# Patient Record
Sex: Male | Born: 1985 | Race: Black or African American | Hispanic: No | Marital: Single | Smoking: Never smoker
Health system: Southern US, Community
[De-identification: ages and names within clinical notes are randomized; demographics above are authoritative.]

## PROBLEM LIST (undated history)

## (undated) ENCOUNTER — Emergency Department (HOSPITAL_BASED_OUTPATIENT_CLINIC_OR_DEPARTMENT_OTHER): Discharge status: Absent without leave | Payer: PRIVATE HEALTH INSURANCE

## (undated) DIAGNOSIS — H2703 Aphakia, bilateral: Secondary | ICD-10-CM

## (undated) DIAGNOSIS — I272 Pulmonary hypertension, unspecified: Secondary | ICD-10-CM

## (undated) DIAGNOSIS — G4733 Obstructive sleep apnea (adult) (pediatric): Secondary | ICD-10-CM

## (undated) DIAGNOSIS — I509 Heart failure, unspecified: Secondary | ICD-10-CM

## (undated) DIAGNOSIS — I10 Essential (primary) hypertension: Secondary | ICD-10-CM

## (undated) DIAGNOSIS — J9601 Acute respiratory failure with hypoxia: Secondary | ICD-10-CM

## (undated) DIAGNOSIS — I5033 Acute on chronic diastolic (congestive) heart failure: Secondary | ICD-10-CM

## (undated) DIAGNOSIS — J449 Chronic obstructive pulmonary disease, unspecified: Secondary | ICD-10-CM

## (undated) DIAGNOSIS — R079 Chest pain, unspecified: Secondary | ICD-10-CM

## (undated) DIAGNOSIS — E119 Type 2 diabetes mellitus without complications: Secondary | ICD-10-CM

## (undated) DIAGNOSIS — H109 Unspecified conjunctivitis: Secondary | ICD-10-CM

## (undated) DIAGNOSIS — H33021 Retinal detachment with multiple breaks, right eye: Secondary | ICD-10-CM

## (undated) DIAGNOSIS — I82409 Acute embolism and thrombosis of unspecified deep veins of unspecified lower extremity: Secondary | ICD-10-CM

## (undated) DIAGNOSIS — Z8679 Personal history of other diseases of the circulatory system: Secondary | ICD-10-CM

## (undated) HISTORY — DX: Heart failure, unspecified: I50.9

## (undated) HISTORY — PX: EYE SURGERY: SHX253

## (undated) HISTORY — PX: RETINAL DETACHMENT SURGERY: SHX105

## (undated) HISTORY — DX: Acute on chronic diastolic (congestive) heart failure: I50.33

## (undated) HISTORY — DX: Morbid (severe) obesity due to excess calories: E66.01

## (undated) HISTORY — DX: Chest pain, unspecified: R07.9

## (undated) HISTORY — DX: Personal history of other diseases of the circulatory system: Z86.79

## (undated) HISTORY — DX: Unspecified conjunctivitis: H10.9

## (undated) HISTORY — DX: Aphakia, bilateral: H27.03

## (undated) HISTORY — DX: Retinal detachment with multiple breaks, right eye: H33.021

## (undated) HISTORY — DX: Obstructive sleep apnea (adult) (pediatric): G47.33

## (undated) HISTORY — PX: CATARACT EXTRACTION: SUR2

---
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
## 2010-08-19 ENCOUNTER — Emergency Department (HOSPITAL_BASED_OUTPATIENT_CLINIC_OR_DEPARTMENT_OTHER): Admission: EM | Admit: 2010-08-19 | Discharge: 2010-08-19 | Payer: Self-pay | Admitting: Emergency Medicine

## 2011-01-12 LAB — HERPES SIMPLEX VIRUS CULTURE

## 2011-01-12 LAB — URINALYSIS, ROUTINE W REFLEX MICROSCOPIC
Glucose, UA: NEGATIVE mg/dL
Nitrite: NEGATIVE
Specific Gravity, Urine: 1.023 (ref 1.005–1.030)
pH: 6.5 (ref 5.0–8.0)

## 2011-01-12 LAB — URINE MICROSCOPIC-ADD ON

## 2011-10-22 ENCOUNTER — Encounter: Payer: Self-pay | Admitting: *Deleted

## 2011-10-22 ENCOUNTER — Emergency Department (HOSPITAL_BASED_OUTPATIENT_CLINIC_OR_DEPARTMENT_OTHER)
Admission: EM | Admit: 2011-10-22 | Discharge: 2011-10-22 | Disposition: A | Discharge status: Home / Self Care | Payer: Self-pay | Attending: Emergency Medicine | Admitting: Emergency Medicine

## 2011-10-22 DIAGNOSIS — H5710 Ocular pain, unspecified eye: Secondary | ICD-10-CM | POA: Insufficient documentation

## 2011-10-22 DIAGNOSIS — H109 Unspecified conjunctivitis: Secondary | ICD-10-CM | POA: Insufficient documentation

## 2011-10-22 MED ORDER — PREDNISOLONE ACETATE 1 % OP SUSP
OPHTHALMIC | Status: AC
  Administered 2011-10-22: 1 [drp] via OPHTHALMIC
  Filled 2011-10-22: qty 5

## 2011-10-22 MED ORDER — PREDNISOLONE ACETATE 1 % OP SUSP
1.0000 [drp] | OPHTHALMIC | Status: DC
Start: 2011-10-23 — End: 2011-10-23
  Administered 2011-10-22: 1 [drp] via OPHTHALMIC

## 2011-10-22 NOTE — ED Notes (Signed)
Left eye markedly swollen- pt states that he thinks he has pink eye and has been using prescribed drops- pt woke up this morning and eye was markedly swollen- denies pain

## 2011-10-22 NOTE — ED Provider Notes (Signed)
History   This chart was scribed for Rolan Bucco, MD scribed by Magnus Sinning. The patient was seen in room MH03/MH03    CSN: [id number]  Arrival date & time 10/22/11  1806   First MD Initiated Contact with Patient 10/22/11 2141      Chief Complaint  Patient presents with  . Eye Problem    (Consider location/radiation/quality/duration/timing/severity/associated sxs/prior treatment) HPI Edward Castro is a 25 y.o. male who presents to the Emergency Department complaining of severe swelling to his left eye with associated redness and discharge, onset 7 days ago. Pt reports that he is unable to see out of left eye due to swelling and has discharge when he wakes up from sleeping. Pt was recently dx'd with pink eye and treated with polymyxin eyedrops with no improvement. Pt denies fever and has a hx of retina detachment, which pt's mother states occurred when he was 18 months. Pt's mother states that pt was treated for retina detachment with laser surgery and scleral buckle repair on left eye.   History reviewed. No pertinent past medical history.  Past Surgical History  Procedure Date  . Eye surgery     History reviewed. No pertinent family history.  History  Substance Use Topics  . Smoking status: Never Smoker   . Smokeless tobacco: Never Used  . Alcohol Use: No      Review of Systems  Constitutional: Negative for fever.  Eyes: Positive for discharge and visual disturbance (Unable to see out of left eye due to swelling).       Swelling of conjunctiva   All other systems reviewed and are negative.    Allergies  Review of patient's allergies indicates no known allergies.  Home Medications   Current Outpatient Rx  Name Route Sig Dispense Refill  . POLYMYXIN B-TRIMETHOPRIM 10000-0.1 UNIT/ML-% OP SOLN Left Eye Place 1 drop into the left eye 4 (four) times daily.        BP 165/99  Pulse 104  Temp(Src) 98.3 F (36.8 C) (Oral)  Resp 18  Ht 5\' 8"  (1.727 m)  Wt 275  lb (124.739 kg)  BMI 41.81 kg/m2  SpO2 94%  Physical Exam  Nursing note and vitals reviewed. Constitutional: He is oriented to person, place, and time. He appears well-developed and well-nourished. No distress.  HENT:  Head: Normocephalic and atraumatic.  Eyes: EOM are normal. Pupils are equal, round, and reactive to light.       Left eye is markedly conjunctiva is red and inflamed. Conjunctiva to the lower eyelid is enlarged and bulging. EOMI intact  Some mild watery drainage   Neck: Normal range of motion. Neck supple.  Cardiovascular: Normal rate, regular rhythm and intact distal pulses.   Pulmonary/Chest: Effort normal and breath sounds normal. No respiratory distress.  Abdominal: Normal appearance. He exhibits no distension.  Musculoskeletal: Normal range of motion.  Neurological: He is alert and oriented to person, place, and time.  Skin: Skin is warm and dry. No rash noted.  Psychiatric: He has a normal mood and affect. His behavior is normal.    ED Course  Procedures (including critical care time) DIAGNOSTIC STUDIES: Oxygen Saturation is 92% on room air, normal by my interpretation.    COORDINATION OF CARE:  Pt with 20/200 visual acuity to left eye, says that this is his baseline (had retinal detachment in that eye as toddler)  1. Conjunctivitis       MDM  Conjunctivitis vs allergic rx.  Pt does not wear contacts.  Spoke with opthamologist on call, Dr. Delaney Meigs who recommends steroid eye drops, cold compresses  (feels that this could be allergic rx to eye drops).  He will see pt in the am at office.  Gave pt contact information and instructions to meet him at 10:30am tomorrow.  Vision is at baseline   I personally performed the services described in this documentation, which was scribed in my presence.  The recorded information has been reviewed and considered.          Rolan Bucco, MD 10/22/11 [phone]

## 2011-10-22 NOTE — ED Notes (Signed)
Patient is resting comfortably, states that he started abx eye drops this morning for pink eye.  Here because both upper and lower eye lids in left eye are markedly swollen.

## 2013-07-27 ENCOUNTER — Emergency Department (HOSPITAL_BASED_OUTPATIENT_CLINIC_OR_DEPARTMENT_OTHER)
Admission: EM | Admit: 2013-07-27 | Discharge: 2013-07-27 | Disposition: A | Discharge status: Other Acute Inpatient Hospital | Payer: BC Managed Care – PPO | Attending: Emergency Medicine | Admitting: Emergency Medicine

## 2013-07-27 ENCOUNTER — Encounter (HOSPITAL_BASED_OUTPATIENT_CLINIC_OR_DEPARTMENT_OTHER): Payer: Self-pay | Admitting: Emergency Medicine

## 2013-07-27 DIAGNOSIS — H5450 Low vision, one eye, unspecified eye: Secondary | ICD-10-CM | POA: Insufficient documentation

## 2013-07-27 DIAGNOSIS — I10 Essential (primary) hypertension: Secondary | ICD-10-CM | POA: Insufficient documentation

## 2013-07-27 DIAGNOSIS — E119 Type 2 diabetes mellitus without complications: Secondary | ICD-10-CM | POA: Insufficient documentation

## 2013-07-27 DIAGNOSIS — H5461 Unqualified visual loss, right eye, normal vision left eye: Secondary | ICD-10-CM

## 2013-07-27 HISTORY — DX: Essential (primary) hypertension: I10

## 2013-07-27 HISTORY — DX: Type 2 diabetes mellitus without complications: E11.9

## 2013-07-27 MED ORDER — TETRACAINE HCL 0.5 % OP SOLN
2.0000 [drp] | Freq: Once | OPHTHALMIC | Status: AC
  Administered 2013-07-27: 2 [drp] via OPHTHALMIC
  Filled 2013-07-27: qty 2

## 2013-07-27 NOTE — ED Provider Notes (Signed)
CSN: [id number]     Arrival date & time 07/27/13  2007 History  This chart was scribed for Rolan Bucco, MD by Greggory Stallion, ED Scribe. This patient was seen in room MH05/MH05 and the patient's care was started at 10:22 PM.   Chief Complaint  Patient presents with  . Blurred Vision   The history is provided by the patient. No language interpreter was used.    HPI Comments: Edward Castro is a 27 y.o. 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 Pt denies injury. Pt denies eye drainage, eye pain, eye redness, headache and trouble speaking. He states he wears glasses daily.   Opthalmologist is Dr. Manson Passey at [facility]  Past Medical History  Diagnosis Date  . Hypertension   . Diabetes mellitus without complication    Past Surgical History  Procedure Laterality Date  . Eye surgery    . Cataract extraction    . Retinal detachment surgery     History reviewed. No pertinent family history. History  Substance Use Topics  . Smoking status: Never Smoker   . Smokeless tobacco: Never Used  . Alcohol Use: No    Review of Systems  Constitutional: Negative for fever, chills, diaphoresis and fatigue.  HENT: Negative for congestion, rhinorrhea and sneezing.   Eyes: Positive for visual disturbance. Negative for pain, discharge and redness.  Respiratory: Negative for cough, chest tightness and shortness of breath.   Cardiovascular: Negative for chest pain and leg swelling.  Gastrointestinal: Negative for nausea, vomiting, abdominal pain, diarrhea and blood in stool.  Genitourinary: Negative for frequency, hematuria, flank pain and difficulty urinating.  Musculoskeletal: Negative for back pain and arthralgias.  Skin: Negative for rash.  Neurological: Negative for dizziness, speech difficulty, weakness, numbness and  headaches.    Allergies  Review of patient's allergies indicates no known allergies.  Home Medications   Current Outpatient Rx  Name  Route  Sig  Dispense  Refill  . trimethoprim-polymyxin b (POLYTRIM) ophthalmic solution   Left Eye   Place 1 drop into the left eye 4 (four) times daily.            BP 122/73  Pulse 78  Temp(Src) 98.4 F (36.9 C) (Oral)  Resp 20  Ht 5\' 8"  (1.727 m)  Wt 310 lb (140.615 kg)  BMI 47.15 kg/m2  SpO2 98%  Physical Exam  Constitutional: He is oriented to person, place, and time. He appears well-developed and well-nourished.  HENT:  Head: Normocephalic and atraumatic.  Eyes: Conjunctivae and EOM are normal. Pupils are equal, round, and reactive to light.  No redness. No drainage. Unable to visualize the fundi.   Neck: Normal range of motion. Neck supple.  Cardiovascular: Normal rate, regular rhythm and normal heart sounds.   Pulmonary/Chest: Effort normal and breath sounds normal. No respiratory distress. He has no wheezes. He has no rales. He exhibits no tenderness.  Abdominal: Soft. Bowel sounds are normal. There is no tenderness. There is no rebound and no guarding.  Musculoskeletal: Normal range of motion. He exhibits no edema.  Lymphadenopathy:    He has no cervical adenopathy.  Neurological: He is alert and oriented to person, place, and time.  Skin: Skin is warm and dry. No rash noted.  Psychiatric: He has a normal mood and affect.    ED Course  Procedures (including critical  care time)  DIAGNOSTIC STUDIES: Oxygen Saturation is 98% on RA, normal by my interpretation.    COORDINATION OF CARE: 10:26 PM-Discussed treatment plan which includes visual acuity with pt at bedside and pt agreed to plan. Advised pt to follow up with his eye doctor.    Labs Review Labs Reviewed - No data to display Imaging Review No results found.  MDM   1. Vision loss of right eye    Patient had normal pressures in the right eye. He has no redness or  drainage from the eye. He has no eye pain. His visual acuity was assessed and he was only able to see the E on the eye chart in the right eye.  I contacted Dr. Royal Hawthorn with ophtho at [facility] who advised to transfer pt on ED at [facility] to be seen tonight.  Pt is going POV with family.  Advised them to go directly to the ED.  I advised the ED physician, Dr. Lyla Son of pt's transfer       I personally performed the services described in this documentation, which was scribed in my presence.  The recorded information has been reviewed and considered.   Rolan Bucco, MD 07/28/13 [phone]

## 2013-07-27 NOTE — ED Notes (Signed)
Pt c/o Left eye blurred vision denies pain , injury , or event onset x1 day

## 2013-07-27 NOTE — ED Notes (Signed)
Pt's father was anxious to begin heading towards [facility].  Mother remained to complete paperwork, will follow them up there in a separate vehicle.  Dr. Fredderick Phenix is aware.  Unable to get 15 minute vital signs prior to leaving.  Spoke with Thayer Ohm, [facility].  He is aware patient is en route.

## 2015-03-30 ENCOUNTER — Encounter (HOSPITAL_BASED_OUTPATIENT_CLINIC_OR_DEPARTMENT_OTHER): Payer: Self-pay | Admitting: Emergency Medicine

## 2015-03-30 ENCOUNTER — Emergency Department (HOSPITAL_BASED_OUTPATIENT_CLINIC_OR_DEPARTMENT_OTHER)
Admission: EM | Admit: 2015-03-30 | Discharge: 2015-03-30 | Disposition: A | Discharge status: Home / Self Care | Payer: 59 | Attending: Emergency Medicine | Admitting: Emergency Medicine

## 2015-03-30 DIAGNOSIS — M25511 Pain in right shoulder: Secondary | ICD-10-CM | POA: Diagnosis present

## 2015-03-30 DIAGNOSIS — M5410 Radiculopathy, site unspecified: Secondary | ICD-10-CM | POA: Insufficient documentation

## 2015-03-30 DIAGNOSIS — Z792 Long term (current) use of antibiotics: Secondary | ICD-10-CM | POA: Diagnosis not present

## 2015-03-30 DIAGNOSIS — Z79899 Other long term (current) drug therapy: Secondary | ICD-10-CM | POA: Diagnosis not present

## 2015-03-30 DIAGNOSIS — E119 Type 2 diabetes mellitus without complications: Secondary | ICD-10-CM | POA: Diagnosis not present

## 2015-03-30 DIAGNOSIS — I10 Essential (primary) hypertension: Secondary | ICD-10-CM | POA: Diagnosis not present

## 2015-03-30 MED ORDER — TRAMADOL HCL 50 MG PO TABS: 50.0000 mg | ORAL_TABLET | Freq: Four times a day (QID) | ORAL | Status: DC | PRN

## 2015-03-30 MED ORDER — PREDNISONE 20 MG PO TABS: ORAL_TABLET | ORAL | Status: DC

## 2015-03-30 MED ORDER — ORPHENADRINE CITRATE ER 100 MG PO TB12: 100.0000 mg | ORAL_TABLET | Freq: Two times a day (BID) | ORAL | Status: DC

## 2015-03-30 NOTE — ED Notes (Signed)
Right shoulder pain for two weeks after using shotgun at shooting range.  Pt states pain to right shoulder radiating to neck.

## 2015-03-30 NOTE — Discharge Instructions (Signed)
Cervical Radiculopathy °Cervical radiculopathy happens when a nerve in the neck is pinched or bruised by a slipped (herniated) disk or by arthritic changes in the bones of the cervical spine. This can occur due to an injury or as part of the normal aging process. Pressure on the cervical nerves can cause pain or numbness that runs from your neck all the way down into your arm and fingers. °CAUSES  °There are many possible causes, including: °· Injury. °· Muscle tightness in the neck from overuse. °· Swollen, painful joints (arthritis). °· Breakdown or degeneration in the bones and joints of the spine (spondylosis) due to aging. °· Bone spurs that may develop near the cervical nerves. °SYMPTOMS  °Symptoms include pain, weakness, or numbness in the affected arm and hand. Pain can be severe or irritating. Symptoms may be worse when extending or turning the neck. °DIAGNOSIS  °Your caregiver will ask about your symptoms and do a physical exam. He or she may test your strength and reflexes. X-rays, CT scans, and MRI scans may be needed in cases of injury or if the symptoms do not go away after a period of time. Electromyography (EMG) or nerve conduction testing may be done to study how your nerves and muscles are working. °TREATMENT  °Your caregiver may recommend certain exercises to help relieve your symptoms. Cervical radiculopathy can, and often does, get better with time and treatment. If your problems continue, treatment options may include: °· Wearing a soft collar for short periods of time. °· Physical therapy to strengthen the neck muscles. °· Medicines, such as nonsteroidal anti-inflammatory drugs (NSAIDs), oral corticosteroids, or spinal injections. °· Surgery. Different types of surgery may be done depending on the cause of your problems. °HOME CARE INSTRUCTIONS  °· Put ice on the affected area. °¨ Put ice in a plastic bag. °¨ Place a towel between your skin and the bag. °¨ Leave the ice on for 15-20 minutes,  03-04 times a day or as directed by your caregiver. °· If ice does not help, you can try using heat. Take a warm shower or bath, or use a hot water bottle as directed by your caregiver. °· You may try a gentle neck and shoulder massage. °· Use a flat pillow when you sleep. °· Only take over-the-counter or prescription medicines for pain, discomfort, or fever as directed by your caregiver. °· If physical therapy was prescribed, follow your caregiver's directions. °· If a soft collar was prescribed, use it as directed. °SEEK IMMEDIATE MEDICAL CARE IF:  °· Your pain gets much worse and cannot be controlled with medicines. °· You have weakness or numbness in your hand, arm, face, or leg. °· You have a high fever or a stiff, rigid neck. °· You lose bowel or bladder control (incontinence). °· You have trouble with walking, balance, or speaking. °MAKE SURE YOU:  °· Understand these instructions. °· Will watch your condition. °· Will get help right away if you are not doing well or get worse. °Document Released: 07/12/2001 Document Revised: 01/09/2012 Document Reviewed: 05/31/2011 °ExitCare® Patient Information ©2015 [employer]. This information is not intended to replace advice given to you by your health care provider. Make sure you discuss any questions you have with your health care provider. ° ° ° °Emergency Department Resource Guide °1) Find a Doctor and Pay Out of Pocket °Although you won't have to find out who is covered by your insurance plan, it is a good idea to ask around and get recommendations. You   will then need to call the office and see if the doctor you have chosen will accept you as a new patient and what types of options they offer for patients who are self-pay. Some doctors offer discounts or will set up payment plans for their patients who do not have insurance, but you will need to ask so you aren't surprised when you get to your appointment. ° °2) Contact Your Local Health Department °Not all  health departments have doctors that can see patients for sick visits, but many do, so it is worth a call to see if yours does. If you don't know where your local health department is, you can check in your phone book. The CDC also has a tool to help you locate your state's health department, and many state websites also have listings of all of their local health departments. ° °3) Find a Walk-in Clinic °If your illness is not likely to be very severe or complicated, you may want to try a walk in clinic. These are popping up all over the country in pharmacies, drugstores, and shopping centers. They're usually staffed by nurse practitioners or physician assistants that have been trained to treat common illnesses and complaints. They're usually fairly quick and inexpensive. However, if you have serious medical issues or chronic medical problems, these are probably not your best option. ° °No Primary Care Doctor: °- Call [facility] at  832-8000 - they can help you locate a primary care doctor that  accepts your insurance, provides certain services, etc. °- Physician Referral Service- 1-800-533-3463 ° °Chronic Pain Problems: °Organization         Address  Phone   Notes  °[facility]  (336) 297-2271 Patients need to be referred by their primary care doctor.  ° °Medication Assistance: °Organization         Address  Phone   Notes  °[place] [facility] [address] °[place], [place] [address] (336) 641-8030 --Must be a resident of [place] °-- Must have NO insurance coverage whatsoever (no Medicaid/ Medicare, etc.) °-- The pt. MUST have a primary care doctor that directs their care regularly and follows them in the community °  °[facility]  (866) 331-1348   °[employer]  (888) 892-1162   ° °Agencies that provide inexpensive medical care: °Organization         Address  Phone   Notes  °[facility]  (336) 832-8035   °[facility]     (336) 832-7272   °[facility] [address] °[place], [place] [address] (336) 832-4777   °[facility] [address], °[place] (336) 271-4999   °[facility]    (336) 373-0678   °[facility]    (336) 272-1050   °[facility] ° [address], [place] Phone:  (336) 832-4444, Fax:  (336) 832-4440 Hours of Operation:  9 am - 6 pm, M-F.  Also accepts Medicaid/Medicare and self-pay.  °[facility] ° [address],  Phone: (336) 832-3150, Fax: (336) 832-3151. Hours of Operation:  8:30 am - 5:30 pm, M-F.  Also accepts Medicaid and self-pay.  °[facility] [place] [address], [place] Phone: (336) 878-6027   °[facility] [address], [place], [place] (336)723-1848, Ext. 123 Mondays & Thursdays: 7-9 AM.  First 15 patients are seen on a first come, first   serve basis. °  ° °Medicaid-accepting [place] Providers: ° °Organization         Address  Phone   Notes  °[facility] [address], [place] (336) 641-2100 Also accepts self-pay patients.  °[facility] [address], [place] ° (336) 856-9996   °[facility] [address], [place] (336) 288-8857   °[facility] [address] [place] [address], [place] (336) 299-7000   °Veita Bland [address], [place]  ° (336) 373-1557 Only accepts [place] Access Medicaid patients after they have their name applied to their card.  ° °Self-Pay (no insurance) in [place]: ° °Organization         Address  Phone   Notes  °Sickle Cell Patients, [facility] [address], [place] (336) 832-1970   °[facility] [address], [place] (336) 832-4400   °[facility] [place] ° [address], [place] (336) 992-4800     °[facility]/Dr. Osei-Bonsu ° [address] [place] [address], [place] or [address], [place] (336) 841-8500 Phone number for both [place] and  [place] locations is the same.  °[facility] [address], [place] (336) 299-0000   °[facility] [address] [place] [address], [place] or [address] (336) 852-7530 °(336) 878-2260   °[facility] [address], [place] (336) 350-1642, phone; (336) 294-5005, fax Sees patients 1st and 3rd Saturday of every month.  Must not qualify for public or private insurance (i.e. Medicaid, Medicare, [place] Health Choice, Veterans' Benefits) • Household income should be no more than 200% of the poverty level •The clinic cannot treat you if you are pregnant or think you are pregnant • Sexually transmitted diseases are not treated at the clinic.  ° ° °Dental Care: °Organization         Address  Phone  Notes  °[facility] [address], [place] (336) 641-6152 Accepts children up to age 21 who are enrolled in Medicaid or [place] Health Choice; pregnant women with a Medicaid card; and children who have applied for Medicaid or [address] Health Choice, but were declined, whose parents can pay a reduced fee at time of service.  °[facility] [place]  [address], [place] (336) 641-7733 Accepts children up to age 21 who are enrolled in Medicaid or [place] Health Choice; pregnant women with a Medicaid card; and children who have applied for Medicaid or [place] Health Choice, but were declined, whose parents can pay a reduced fee at time of service.  °[facility] ° [address], [place] (336) 641-4533 Patients are seen by appointment only. Walk-ins are not accepted. [facility] will see patients 18 years of age and older. °Monday - Tuesday (8am-5pm) °Most Wednesdays (8:30-5pm) °$30 per visit,  cash only  °[facility] ° [address], [place] (336) 641-4533 Patients are seen by appointment only. Walk-ins are not accepted. [facility] will see patients 18 years of age and older. °One Wednesday Evening (Monthly: Volunteer Based).  $30 per visit, cash only  °[facility]  (919) 537-3737 for adults; Children under age 4, call Graduate Pediatric Dentistry at (919) [phone]. Children aged 4-14, please   call (919) [phone] to request a pediatric application. ° Dental services are provided in all areas of dental care including fillings, crowns and bridges, complete and partial dentures, implants, gum treatment, root canals, and extractions. Preventive care is also provided. Treatment is provided to both adults and children. °Patients are selected via a lottery and there is often a waiting list. °  °[facility] [address], °[place] ° (336) 763-8833 [email or web address] °  °[facility] [address], [place], [place] (336)723-1848, Ext. 123 Second and Fourth Thursday of each month, opens at 6:30 AM; Clinic ends at 9 AM.  Patients are seen on a first-come first-served basis, and a limited number are seen during each clinic.  ° °[facility] ° [address], [place], [place] (336) 723-7904   Eligibility Requirements °You must have lived in [place], or [place] counties for at least the last three months. °  You cannot be eligible for state or federal sponsored healthcare insurance, including Veterans Administration, Medicaid, or Medicare. °  You generally cannot be eligible for healthcare insurance through your employer.  °  How to apply: °Eligibility screenings are held every Tuesday and Wednesday afternoon from 1:00 pm until 4:00 pm. You do not need an appointment for the interview!  °[facility] [address], [place], [place] 336-631-2330   °[facility]   336-342-8273   °[facility]  336-703-3100   °[facility]  336-570-6415   ° °Behavioral Health Resources in the Community: °Intensive Outpatient Programs °Organization         Address  Phone  Notes  °[facility] [address], [place], [place] 336-878-6098   °[facility] [address], [place], [place] 336-832-9800   °[facility] [address], [place], [place] ° 336-882-2125   °[facility] [address],  °[place], [place] 1-800-853-5163 or 336-641-4981   °Substance Abuse Resources °Organization         Address  Phone  Notes  °[facility]  336-882-2125   °[facility]  336-784-9470   °[facility]  336-285-9073   °[facility]  336-845-3988   °Residential & Outpatient Substance Abuse Program  1-800-659-3381   °Psychological Services °Organization         Address  Phone  Notes  °[facility]  336- 832-9600   °[facility]  336- 378-7881   °[facility] [address], [place] 1-800-853-5163 or 336-641-4981   ° °Mobile Crisis Teams °Organization         Address  Phone  Notes  °[facility], Mobile Crisis Care Unit  1-877-626-1772   °Assertive °[facility] ° [address] [place], [place] 336-834-9664   °Sharon DeEsch [address] °[place] 336-554-5454   ° °Self-Help/Support Groups °Organization         Address  Phone             Notes  °[facility] [place] - variety of support groups  336- 373-1402 Call for more information  °Narcotics Anonymous (NA), [facility] [address], °[place]  2 meetings at this location  ° °Residential Treatment Programs °Organization         Address  Phone  Notes  °[facility] [address],    °[place]  1-866-801-8205   °[facility] °   [address], [place], [place] 704-293-8524    °[facility] [address], [place] 336-845-3988 Admissions: 8am-3pm M-F  °[facility] [address]    °[place], [place] 336-841-1104   °[facility] [address], [place], [place] 336-379-7146   °[facility] [address]  °[place], [place] 336-285-9073   °[facility] Intensive Outpatient [address], [place], [place] 336-852-3033   °[facility] [address]  °[place], [place] 1-877-615-2722 or 336-784-9470   °[facility] [address] [place], [place] 336-227-7417 Accepts Medicaid  °[facility] [address]  °[place] 1-800-659-3381 Substance Abuse/Addiction Treatment  ° °[place] Behavioral Health Resources °Organization         Address  Phone  Notes  °[facility]  (888) 581-9988   °Julie Brannon, PhD [address] [place], [place]   (336) 349-5553 or (336) 951-0000   °[facility]   [address] °[place], [place] (336) 349-4454   °[facility] [address], [place], [place] (336) 342-8316 Insurance/Medicaid/sponsorship through [facility]  °[facility] [address]                                    [place], [place] (336) 342-8316 Therapy/tele-psych/case  °[facility] [address]  ° [place], [place] (336) 349-2233    °Dr. Arfeen  (336) 349-4544   °[facility] [place]  [employer] [place] [facility] 1) [address], [place] °2) [address], [place] °3)  [address], [place] (336) 349-3220 °(336) 342-7768 ° °(336) 342-8140   °[place] [facility] (336) 342-1394 or (336) 342-3537 (After Hours)    ° ° ° °

## 2015-03-30 NOTE — ED Provider Notes (Signed)
CSN: [id number]     Arrival date & time 03/30/15  1046 History   First MD Initiated Contact with Patient 03/30/15 1102     Chief Complaint  Patient presents with  . Shoulder Pain     (Consider location/radiation/quality/duration/timing/severity/associated sxs/prior Treatment) HPI The patient states he went to a shooting range 2 weeks ago and was out there for about 2 hours. He does not typically do that. Since that time he has been developing pain on the lateral right neck and across the top of the shoulder. He reports it is getting worse and he gets a pain that shoots all the way down to his hand. It is made worse by talking his head back and moving his neck in a certain position. No associated weakness numbness tingling or swelling. Past Medical History  Diagnosis Date  . Hypertension   . Diabetes mellitus without complication    Past Surgical History  Procedure Laterality Date  . Eye surgery    . Cataract extraction    . Retinal detachment surgery     No family history on file. History  Substance Use Topics  . Smoking status: Never Smoker   . Smokeless tobacco: Never Used  . Alcohol Use: No    Review of Systems Constitutional: No fever chills or general illness Respiratory: No chest pain, shortness of breath or cough.   Allergies  Review of patient's allergies indicates no known allergies.  Home Medications   Prior to Admission medications   Medication Sig Start Date End Date Taking? Authorizing Provider  timolol (TIMOPTIC) 0.25 % ophthalmic solution Place 1 drop into both eyes 2 (two) times daily.   Yes Historical Provider, MD  orphenadrine (NORFLEX) 100 MG tablet Take 1 tablet (100 mg total) by mouth 2 (two) times daily. 03/30/15   Arby BarretteMarcy Jacey Eckerson, MD  predniSONE (DELTASONE) 20 MG tablet 3 tabs po day one, then 2 po daily x 4 days 03/30/15   Arby BarretteMarcy Paula Busenbark, MD  traMADol (ULTRAM) 50 MG tablet Take 1 tablet (50 mg total) by mouth every 6 (six) hours as needed. 03/30/15    Arby BarretteMarcy Eliazer Hemphill, MD  trimethoprim-polymyxin b (POLYTRIM) ophthalmic solution Place 1 drop into the left eye 4 (four) times daily.      Historical Provider, MD   BP 139/75 mmHg  Pulse 102  Temp(Src) 97.7 F (36.5 C) (Oral)  Resp 20  Ht 5\' 8"  (1.727 m)  Wt 317 lb (143.79 kg)  BMI 48.21 kg/m2  SpO2 98% Physical Exam  Constitutional:  Patient is morbidly obese nontoxic and alert.  HENT:  Head: Normocephalic and atraumatic.  Neck: No tracheal deviation present.  Patient has pain at the top of the trapezius on the right. No midline cervical pain to palpation. Patient is morbidly obese however no soft tissue swelling. Pain is reproducible by head extension and turning side to side.  Cardiovascular: Normal rate, regular rhythm, normal heart sounds and intact distal pulses.   Pulmonary/Chest: Effort normal and breath sounds normal. No stridor.  Musculoskeletal: Normal range of motion. He exhibits no edema or tenderness.  Right upper extremity has 2+ pulses, normal grip strength, normal flexion and extension strength.    ED Course  Procedures (including critical care time) Labs Review Labs Reviewed - No data to display  Imaging Review No results found.   EKG Interpretation None      MDM   Final diagnoses:  Radiculopathy affecting upper extremity   History and exam consistent with radiculopathy. No neurologic or vascular dysfunction.  Patient be treated with prednisone and Norflex and tramadol with instructions for follow-up.    Arby Barrette, MD 03/30/15 1710

## 2016-03-18 ENCOUNTER — Emergency Department (HOSPITAL_BASED_OUTPATIENT_CLINIC_OR_DEPARTMENT_OTHER): Payer: PRIVATE HEALTH INSURANCE

## 2016-03-18 ENCOUNTER — Emergency Department (HOSPITAL_BASED_OUTPATIENT_CLINIC_OR_DEPARTMENT_OTHER)
Admission: EM | Admit: 2016-03-18 | Discharge: 2016-03-19 | Disposition: A | Discharge status: Home / Self Care | Payer: PRIVATE HEALTH INSURANCE | Attending: Emergency Medicine | Admitting: Emergency Medicine

## 2016-03-18 ENCOUNTER — Encounter (HOSPITAL_BASED_OUTPATIENT_CLINIC_OR_DEPARTMENT_OTHER): Payer: Self-pay

## 2016-03-18 DIAGNOSIS — E119 Type 2 diabetes mellitus without complications: Secondary | ICD-10-CM | POA: Insufficient documentation

## 2016-03-18 DIAGNOSIS — R0602 Shortness of breath: Secondary | ICD-10-CM | POA: Diagnosis not present

## 2016-03-18 DIAGNOSIS — L03116 Cellulitis of left lower limb: Secondary | ICD-10-CM | POA: Diagnosis not present

## 2016-03-18 DIAGNOSIS — L03115 Cellulitis of right lower limb: Secondary | ICD-10-CM | POA: Diagnosis not present

## 2016-03-18 DIAGNOSIS — I10 Essential (primary) hypertension: Secondary | ICD-10-CM | POA: Insufficient documentation

## 2016-03-18 DIAGNOSIS — M7989 Other specified soft tissue disorders: Secondary | ICD-10-CM | POA: Diagnosis present

## 2016-03-18 DIAGNOSIS — Z79899 Other long term (current) drug therapy: Secondary | ICD-10-CM | POA: Insufficient documentation

## 2016-03-18 LAB — URINALYSIS, ROUTINE W REFLEX MICROSCOPIC
Bilirubin Urine: NEGATIVE
GLUCOSE, UA: NEGATIVE mg/dL
Hgb urine dipstick: NEGATIVE
KETONES UR: NEGATIVE mg/dL
LEUKOCYTES UA: NEGATIVE
NITRITE: NEGATIVE
Specific Gravity, Urine: 1.021 (ref 1.005–1.030)
pH: 6 (ref 5.0–8.0)

## 2016-03-18 LAB — CBC
HCT: 56.6 % — ABNORMAL HIGH (ref 39.0–52.0)
HEMOGLOBIN: 17.8 g/dL — AB (ref 13.0–17.0)
MCH: 30.6 pg (ref 26.0–34.0)
MCHC: 31.4 g/dL (ref 30.0–36.0)
MCV: 97.3 fL (ref 78.0–100.0)
PLATELETS: 166 10*3/uL (ref 150–400)
RBC: 5.82 MIL/uL — AB (ref 4.22–5.81)
RDW: 14.9 % (ref 11.5–15.5)
WBC: 9.1 10*3/uL (ref 4.0–10.5)

## 2016-03-18 LAB — TROPONIN I

## 2016-03-18 LAB — HEPATIC FUNCTION PANEL
ALK PHOS: 71 U/L (ref 38–126)
ALT: 31 U/L (ref 17–63)
AST: 26 U/L (ref 15–41)
Albumin: 3.2 g/dL — ABNORMAL LOW (ref 3.5–5.0)
BILIRUBIN DIRECT: 0.1 mg/dL (ref 0.1–0.5)
BILIRUBIN INDIRECT: 0.5 mg/dL (ref 0.3–0.9)
BILIRUBIN TOTAL: 0.6 mg/dL (ref 0.3–1.2)
TOTAL PROTEIN: 7.4 g/dL (ref 6.5–8.1)

## 2016-03-18 LAB — LIPASE, BLOOD: Lipase: 23 U/L (ref 11–51)

## 2016-03-18 LAB — BASIC METABOLIC PANEL
ANION GAP: 6 (ref 5–15)
BUN: 17 mg/dL (ref 6–20)
CALCIUM: 8.6 mg/dL — AB (ref 8.9–10.3)
CO2: 33 mmol/L — AB (ref 22–32)
Chloride: 100 mmol/L — ABNORMAL LOW (ref 101–111)
Creatinine, Ser: 0.91 mg/dL (ref 0.61–1.24)
GFR calc non Af Amer: >60 mL/min (ref >60)
Glucose, Bld: 115 mg/dL — ABNORMAL HIGH (ref 65–99)
POTASSIUM: 3.9 mmol/L (ref 3.5–5.1)
Sodium: 139 mmol/L (ref 135–145)

## 2016-03-18 LAB — URINE MICROSCOPIC-ADD ON

## 2016-03-18 LAB — BRAIN NATRIURETIC PEPTIDE: B Natriuretic Peptide: 16 pg/mL (ref 0.0–100.0)

## 2016-03-18 LAB — D-DIMER, QUANTITATIVE (NOT AT ARMC): D DIMER QUANT: 0.3 ug{FEU}/mL (ref 0.00–0.50)

## 2016-03-18 MED ORDER — SODIUM CHLORIDE 0.9 % IV BOLUS (SEPSIS)
500.0000 mL | Freq: Once | INTRAVENOUS | Status: AC
  Administered 2016-03-18: 500 mL via INTRAVENOUS

## 2016-03-18 MED ORDER — VANCOMYCIN HCL IN DEXTROSE 1-5 GM/200ML-% IV SOLN
1000.0000 mg | Freq: Once | INTRAVENOUS | Status: AC
  Administered 2016-03-18: 1000 mg via INTRAVENOUS
  Filled 2016-03-18: qty 200

## 2016-03-18 MED ORDER — SODIUM CHLORIDE 0.9 % IV SOLN: INTRAVENOUS | Status: DC

## 2016-03-18 NOTE — ED Notes (Signed)
Pt reports redness and swelling to bilateral legs x 1 month. Reports [facility] due to low oxygen levels, legs swelling and possible diabetes.

## 2016-03-18 NOTE — ED Provider Notes (Addendum)
CSN: [id number]     Arrival date & time 03/18/16  1918 History  By signing my name below, I, Iona Beard, attest that this documentation has been prepared under the direction and in the presence of Vanetta Mulders, MD.   Electronically Signed: Iona Beard, ED Scribe. 03/19/2016. 12:42 AM   Chief Complaint  Patient presents with  . Leg Swelling   Patient is a 30 y.o. male presenting with shortness of breath. The history is provided by the patient. No language interpreter was used.  Shortness of Breath Severity:  Mild Onset quality:  Gradual Timing:  Constant Progression:  Unchanged Chronicity:  Chronic Relieved by:  Nothing Worsened by:  Nothing tried Ineffective treatments:  None tried Associated symptoms: rash   Associated symptoms: no abdominal pain, no chest pain, no cough, no fever, no headaches, no sore throat and no vomiting    HPI Comments: Edward Castro is a 30 y.o. male with PMHx of DM and HTN who presents to the Emergency Department complaining of gradual onset, bilateral leg swelling and bilateral leg erythema, ongoing for one month. Pt reports associated hypoxia. He was seen at [facility] today and sent to the ED for further evaluation. No other associated symptoms noted. No worsening or alleviating factors noted. Pt denies chest pain, abdominal pain, or any other pertinent symptoms.   Past Medical History  Diagnosis Date  . Hypertension   . Diabetes mellitus without complication [facility])    Past Surgical History  Procedure Laterality Date  . Eye surgery    . Cataract extraction    . Retinal detachment surgery     No family history on file. Social History  Substance Use Topics  . Smoking status: Never Smoker   . Smokeless tobacco: Never Used  . Alcohol Use: No    Review of Systems  Constitutional: Negative for fever and chills.  HENT: Negative for congestion, rhinorrhea and sore throat.   Eyes: Negative for visual disturbance.  Respiratory: Positive for  shortness of breath. Negative for cough.   Cardiovascular: Positive for leg swelling. Negative for chest pain.  Gastrointestinal: Negative for nausea, vomiting, abdominal pain and diarrhea.  Genitourinary: Negative for dysuria.  Musculoskeletal: Negative for back pain.  Skin: Positive for rash.  Allergic/Immunologic: Negative for immunocompromised state.  Neurological: Negative for headaches.    Allergies  Review of patient's allergies indicates no known allergies.  Home Medications   Prior to Admission medications   Medication Sig Start Date End Date Taking? Authorizing Provider  doxycycline (VIBRAMYCIN) 100 MG capsule Take 1 capsule (100 mg total) by mouth 2 (two) times daily. 03/19/16   Vanetta Mulders, MD  naproxen (NAPROSYN) 500 MG tablet Take 1 tablet (500 mg total) by mouth 2 (two) times daily. 03/19/16   Vanetta Mulders, MD  orphenadrine (NORFLEX) 100 MG tablet Take 1 tablet (100 mg total) by mouth 2 (two) times daily. 03/30/15   Arby Barrette, MD  predniSONE (DELTASONE) 20 MG tablet 3 tabs po day one, then 2 po daily x 4 days 03/30/15   Arby Barrette, MD  timolol (TIMOPTIC) 0.25 % ophthalmic solution Place 1 drop into both eyes 2 (two) times daily.    Historical Provider, MD  traMADol (ULTRAM) 50 MG tablet Take 1 tablet (50 mg total) by mouth every 6 (six) hours as needed. 03/30/15   Arby Barrette, MD  trimethoprim-polymyxin b (POLYTRIM) ophthalmic solution Place 1 drop into the left eye 4 (four) times daily.      Historical Provider, MD   BP  158/117 mmHg  Pulse 105  Temp(Src) 98 F (36.7 C) (Oral)  Resp 20  Ht 5\' 6"  (1.676 m)  Wt 343 lb 12.8 oz (155.947 kg)  BMI 55.52 kg/m2  SpO2 89% Physical Exam  Constitutional: He is oriented to person, place, and time. He appears well-developed and well-nourished.  HENT:  Head: Normocephalic and atraumatic.  Mouth/Throat: Mucous membranes are not dry.  Eyes: Conjunctivae and EOM are normal. Pupils are equal, round, and reactive to  light. No scleral icterus.  Neck: Normal range of motion.  Cardiovascular: Normal rate, regular rhythm, normal heart sounds and intact distal pulses.   Pulmonary/Chest: Effort normal and breath sounds normal. No respiratory distress. He has no wheezes. He has no rales.  Abdominal: Soft. Bowel sounds are normal. He exhibits no distension. There is no tenderness. There is no rebound and no guarding.  Musculoskeletal: Normal range of motion. He exhibits edema.       Right lower leg: He exhibits swelling.       Left lower leg: He exhibits swelling.  Bilateral swelling to both legs. Erythema noted to 2/3 of patient's legs from ankles up. Good capillary refill to both legs.    Neurological: He is alert and oriented to person, place, and time. No cranial nerve deficit.  Skin: Skin is warm and dry. There is erythema.  Psychiatric: He has a normal mood and affect. Judgment normal.  Nursing note and vitals reviewed.   ED Course  Procedures (including critical care time) DIAGNOSTIC STUDIES: Oxygen Saturation is 89% on RA, low by my interpretation.    COORDINATION OF CARE: 8:53 PM Discussed treatment plan with pt at bedside and pt agreed to plan.    Labs Review Labs Reviewed  BASIC METABOLIC PANEL - Abnormal; Notable for the following:    Chloride 100 (*)    CO2 33 (*)    Glucose, Bld 115 (*)    Calcium 8.6 (*)    All other components within normal limits  CBC - Abnormal; Notable for the following:    RBC 5.82 (*)    Hemoglobin 17.8 (*)    HCT 56.6 (*)    All other components within normal limits  HEPATIC FUNCTION PANEL - Abnormal; Notable for the following:    Albumin 3.2 (*)    All other components within normal limits  URINALYSIS, ROUTINE W REFLEX MICROSCOPIC (NOT AT [facility]) - Abnormal; Notable for the following:    Protein, ur >300 (*)    All other components within normal limits  URINE MICROSCOPIC-ADD ON - Abnormal; Notable for the following:    Squamous Epithelial / LPF 0-5 (*)     Bacteria, UA RARE (*)    All other components within normal limits  TROPONIN I  BRAIN NATRIURETIC PEPTIDE  LIPASE, BLOOD  D-DIMER, QUANTITATIVE (NOT AT [facility])   Results for orders placed or performed during the hospital encounter of 03/18/16  Basic metabolic panel  Result Value Ref Range   Sodium 139 135 - 145 mmol/L   Potassium 3.9 3.5 - 5.1 mmol/L   Chloride 100 (L) 101 - 111 mmol/L   CO2 33 (H) 22 - 32 mmol/L   Glucose, Bld 115 (H) 65 - 99 mg/dL   BUN 17 6 - 20 mg/dL   Creatinine, Ser 4.54 0.61 - 1.24 mg/dL   Calcium 8.6 (L) 8.9 - 10.3 mg/dL   GFR calc non Af Amer >60 >60 mL/min   GFR calc Af Amer >60 >60 mL/min   Anion gap 6  5 - 15  CBC  Result Value Ref Range   WBC 9.1 4.0 - 10.5 K/uL   RBC 5.82 (H) 4.22 - 5.81 MIL/uL   Hemoglobin 17.8 (H) 13.0 - 17.0 g/dL   HCT 40.956.6 (H) 81.139.0 - 91.452.0 %   MCV 97.3 78.0 - 100.0 fL   MCH 30.6 26.0 - 34.0 pg   MCHC 31.4 30.0 - 36.0 g/dL   RDW 78.214.9 95.611.5 - 21.315.5 %   Platelets 166 150 - 400 K/uL  Troponin I  Result Value Ref Range   Troponin I <0.03 <0.031 ng/mL  Brain natriuretic peptide  Result Value Ref Range   B Natriuretic Peptide 16.0 0.0 - 100.0 pg/mL  Lipase, blood  Result Value Ref Range   Lipase 23 11 - 51 U/L  Hepatic function panel  Result Value Ref Range   Total Protein 7.4 6.5 - 8.1 g/dL   Albumin 3.2 (L) 3.5 - 5.0 g/dL   AST 26 15 - 41 U/L   ALT 31 17 - 63 U/L   Alkaline Phosphatase 71 38 - 126 U/L   Total Bilirubin 0.6 0.3 - 1.2 mg/dL   Bilirubin, Direct 0.1 0.1 - 0.5 mg/dL   Indirect Bilirubin 0.5 0.3 - 0.9 mg/dL  D-dimer, quantitative (not at [facility])  Result Value Ref Range   D-Dimer, Quant 0.30 0.00 - 0.50 ug/mL-FEU  Urinalysis, Routine w reflex microscopic (not at [facility])  Result Value Ref Range   Color, Urine YELLOW YELLOW   APPearance CLEAR CLEAR   Specific Gravity, Urine 1.021 1.005 - 1.030   pH 6.0 5.0 - 8.0   Glucose, UA NEGATIVE NEGATIVE mg/dL   Hgb urine dipstick NEGATIVE NEGATIVE   Bilirubin Urine  NEGATIVE NEGATIVE   Ketones, ur NEGATIVE NEGATIVE mg/dL   Protein, ur >086>300 (A) NEGATIVE mg/dL   Nitrite NEGATIVE NEGATIVE   Leukocytes, UA NEGATIVE NEGATIVE  Urine microscopic-add on  Result Value Ref Range   Squamous Epithelial / LPF 0-5 (A) NONE SEEN   WBC, UA 0-5 0 - 5 WBC/hpf   RBC / HPF 0-5 0 - 5 RBC/hpf   Bacteria, UA RARE (A) NONE SEEN     Imaging Review Dg Chest 2 View  03/18/2016  CLINICAL DATA:  Shortness of breath. EXAM: CHEST  2 VIEW COMPARISON:  None. FINDINGS: The study is limited due to patient body habitus. No pneumothorax. The heart size borderline. The hila and mediastinum are unremarkable. The left lung is clear without acute abnormality. Mild opacity is not excluded in the medial right lung base although vascular crowding is possible. IMPRESSION: Probable vascular crowding in the medial right lung base. Mild infiltrate is less likely. The study is limited by patient body habitus. Recommend follow-up as clinically warranted. Electronically Signed   By: Gerome Samavid  Williams III M.D   On: 03/18/2016 20:36   Ct Chest Wo Contrast  03/19/2016  CLINICAL DATA:  Hypoxia.  Bilateral leg swelling and erythema. EXAM: CT CHEST WITHOUT CONTRAST TECHNIQUE: Multidetector CT imaging of the chest was performed following the standard protocol without IV contrast. COMPARISON:  Chest x-ray from earlier today FINDINGS: The central airways are normal. No pneumothorax. No suspicious pulmonary nodules, masses, or infiltrates. The central great vessels are normal in caliber. The heart size is unremarkable. No effusions. No adenopathy is identified. No effusions. Limited views of the upper abdomen are normal. The bones are unremarkable. IMPRESSION: No acute abnormalities to explain the patient's symptoms. Electronically Signed   By: Gerome Samavid  Williams III M.D  On: 03/19/2016 00:35   I have personally reviewed and evaluated these images and lab results as part of my medical decision-making.   EKG  Interpretation   Date/Time:  Friday Mar 18 2016 19:54:08 EDT Ventricular Rate:  103 PR Interval:  199 QRS Duration: 90 QT Interval:  354 QTC Calculation: 463 R Axis:   117 Text Interpretation:  Sinus tachycardia Borderline prolonged PR interval  Right axis deviation No previous ECGs available Confirmed by Reona Zendejas   MD, Ernisha Sorn [phone]) on 03/18/2016 8:54:13 PM      MDM   Final diagnoses:  Cellulitis of left lower extremity  Cellulitis of right lower extremity    Patient with bilateral leg swelling and erythema lower two thirds of both legs. Consistent with cellulitis or perhaps chronic changes due to the edema. Patient's labs without significant findings including normal white count, no fever, troponin negative d-dimer negative for concerns for pulmonary embolus. Chest x-ray with questionable findings so CT chest was ordered to rule out pneumonia or pulmonary edema. But BNP is not elevated at all. Patient does have home oxygen usually just uses it at night he has a concentrator. The patient does have an oxygen requirement here off oxygen he sats down to about 87% but on 2 L of oxygen his oxygen sats are above 95%. Patient could continue this at home. Also will treat clinically for cellulitis received a dose of vancomycin but as stated as above this just may be chronic changes due to the leg swelling. Since it is bilateral. However will continue with an antibiotic at home.  If CT of the chest does not does show of findings requiring admission patient will be discharged home on doxycycline and follow-up with primary care doctor. Patient's blood sugar is not significantly elevated and patient shows no evidence of an acidosis.  [place] to the belly stooped patient CT of the chest without any acute findings. Patient can be discharged home on home oxygen antibiotics and follow-up with her regular doctor.  I personally performed the services described in this documentation, which was scribed in my  presence. The recorded information has been reviewed and is accurate.      Vanetta Mulders, MD 03/19/16 0006  Vanetta Mulders, MD 03/19/16 [phone]

## 2016-03-18 NOTE — Progress Notes (Signed)
Placed patient on a 2 liter nasal cannula due to a room air SPO2 between 88% and 89%.  Patients SPO2 increased and remained at 98% when placed on a 2 liter nasal cannula.

## 2016-03-19 MED ORDER — NAPROXEN 500 MG PO TABS: 500.0000 mg | ORAL_TABLET | Freq: Two times a day (BID) | ORAL | Status: DC

## 2016-03-19 MED ORDER — DOXYCYCLINE HYCLATE 100 MG PO CAPS: 100.0000 mg | ORAL_CAPSULE | Freq: Two times a day (BID) | ORAL | Status: DC

## 2016-03-19 NOTE — Discharge Instructions (Signed)
Take antibiotic as directed. Negative limited to follow-up with your doctor. Take the Naprosyn as needed for discomfort. Usually her oxygen 2 L on a regular basis until seen in follow-up with your regular doctor. Return for any new or worse symptoms. Swelling in both legs may be a skin infection also could just be chronic changes due to the leg edema.

## 2016-10-14 ENCOUNTER — Emergency Department (HOSPITAL_BASED_OUTPATIENT_CLINIC_OR_DEPARTMENT_OTHER)
Admission: EM | Admit: 2016-10-14 | Discharge: 2016-10-14 | Disposition: A | Discharge status: Home / Self Care | Payer: PRIVATE HEALTH INSURANCE | Attending: Emergency Medicine | Admitting: Emergency Medicine

## 2016-10-14 ENCOUNTER — Emergency Department (HOSPITAL_BASED_OUTPATIENT_CLINIC_OR_DEPARTMENT_OTHER): Payer: PRIVATE HEALTH INSURANCE

## 2016-10-14 ENCOUNTER — Encounter (HOSPITAL_BASED_OUTPATIENT_CLINIC_OR_DEPARTMENT_OTHER): Payer: Self-pay | Admitting: Emergency Medicine

## 2016-10-14 DIAGNOSIS — S80821A Blister (nonthermal), right lower leg, initial encounter: Secondary | ICD-10-CM | POA: Diagnosis not present

## 2016-10-14 DIAGNOSIS — S80822A Blister (nonthermal), left lower leg, initial encounter: Secondary | ICD-10-CM | POA: Diagnosis not present

## 2016-10-14 DIAGNOSIS — Z79899 Other long term (current) drug therapy: Secondary | ICD-10-CM | POA: Diagnosis not present

## 2016-10-14 DIAGNOSIS — Y929 Unspecified place or not applicable: Secondary | ICD-10-CM | POA: Diagnosis not present

## 2016-10-14 DIAGNOSIS — E119 Type 2 diabetes mellitus without complications: Secondary | ICD-10-CM | POA: Insufficient documentation

## 2016-10-14 DIAGNOSIS — X58XXXA Exposure to other specified factors, initial encounter: Secondary | ICD-10-CM | POA: Insufficient documentation

## 2016-10-14 DIAGNOSIS — J811 Chronic pulmonary edema: Secondary | ICD-10-CM | POA: Diagnosis not present

## 2016-10-14 DIAGNOSIS — Y999 Unspecified external cause status: Secondary | ICD-10-CM | POA: Diagnosis not present

## 2016-10-14 DIAGNOSIS — I110 Hypertensive heart disease with heart failure: Secondary | ICD-10-CM | POA: Insufficient documentation

## 2016-10-14 DIAGNOSIS — I509 Heart failure, unspecified: Secondary | ICD-10-CM | POA: Insufficient documentation

## 2016-10-14 DIAGNOSIS — R600 Localized edema: Secondary | ICD-10-CM | POA: Diagnosis present

## 2016-10-14 DIAGNOSIS — R0989 Other specified symptoms and signs involving the circulatory and respiratory systems: Secondary | ICD-10-CM

## 2016-10-14 DIAGNOSIS — Y9389 Activity, other specified: Secondary | ICD-10-CM | POA: Diagnosis not present

## 2016-10-14 LAB — BASIC METABOLIC PANEL
Anion gap: 8 (ref 5–15)
BUN: 14 mg/dL (ref 6–20)
CO2: 35 mmol/L — ABNORMAL HIGH (ref 22–32)
Calcium: 8.8 mg/dL — ABNORMAL LOW (ref 8.9–10.3)
Chloride: 100 mmol/L — ABNORMAL LOW (ref 101–111)
Creatinine, Ser: 1.07 mg/dL (ref 0.61–1.24)
GFR calc Af Amer: >60 mL/min (ref >60)
GFR calc non Af Amer: >60 mL/min (ref >60)
Glucose, Bld: 94 mg/dL (ref 65–99)
Potassium: 4.3 mmol/L (ref 3.5–5.1)
Sodium: 143 mmol/L (ref 135–145)

## 2016-10-14 LAB — CBC
HCT: 61.1 % — ABNORMAL HIGH (ref 39.0–52.0)
Hemoglobin: 18.7 g/dL — ABNORMAL HIGH (ref 13.0–17.0)
MCH: 29.7 pg (ref 26.0–34.0)
MCHC: 30.6 g/dL (ref 30.0–36.0)
MCV: 97 fL (ref 78.0–100.0)
Platelets: 169 10*3/uL (ref 150–400)
RBC: 6.3 MIL/uL — ABNORMAL HIGH (ref 4.22–5.81)
RDW: 17.7 % — ABNORMAL HIGH (ref 11.5–15.5)
WBC: 8 10*3/uL (ref 4.0–10.5)

## 2016-10-14 LAB — TROPONIN I: Troponin I: <0.03 ng/mL (ref <0.03)

## 2016-10-14 LAB — BRAIN NATRIURETIC PEPTIDE: B NATRIURETIC PEPTIDE 5: 53.9 pg/mL (ref 0.0–100.0)

## 2016-10-14 MED ORDER — DOXYCYCLINE HYCLATE 100 MG PO CAPS: 100.0000 mg | ORAL_CAPSULE | Freq: Two times a day (BID) | ORAL | 0 refills | Status: DC

## 2016-10-14 NOTE — ED Triage Notes (Signed)
Patient states that he started to have a blister to his right shin yesterday. History of cellulitis to both legs in the past

## 2016-10-14 NOTE — ED Provider Notes (Signed)
[facility] Provider Note   CSN: [id number] Arrival date & time: 10/14/16  2147  By signing my name below, I, Emmanuella Mensah, attest that this documentation has been prepared under the direction and in the presence of Sharilyn Sites, PA-C. Electronically Signed: Angelene Giovanni, ED Scribe. 10/14/16. 10:28 PM.   History   Chief Complaint Chief Complaint  Patient presents with  . Leg Pain    HPI Comments: Edward Castro is a 30 y.o. male with a hx of DM, hypertension, CHF, and sleep apnea who presents to the Emergency Department complaining of a gradually worsening large blister to his right shin onset yesterday. He reports associated itching to his right lower leg. He denies any injuries or trauma to the leg but states that he was sitting in a car behind and felt heat on his lower leg from heated seat while traveling. He reports a hx of DVT in his right leg several years ago after being admitted to the hospital for other reasons. He adds that he has not been on anti-coagulants for the past 5-6 years. States he felt like he was getting cellulitis in his leg again which is what prompted him to come in.  He has baseline peripheral edema of the legs, reports this is unchanged.  Usually worse during the day as he stands a lot for his job.  States swelling today is no significant change from baseline.  On arrival here, patient's O2 sats 83% on RA.  He reports that he uses 2L - 3L of home O2 PRN at home, usually while sleeping, less so during the day. States his baseline O2 sat is around 88-93%.   Denies any SOB at present.  No chest pain.  States he feels at baseline.  No fever, chills, sweats.  Patient does take lasix.  He also has hx of sleep apnea and is awaiting sleep study which should take place in one week.  He does take 20mg  lasix daily.  The history is provided by the patient. No language interpreter was used.    Past Medical History:  Diagnosis Date  . Diabetes mellitus  without complication (HCC)   . Hypertension     There are no active problems to display for this patient.   Past Surgical History:  Procedure Laterality Date  . CATARACT EXTRACTION    . EYE SURGERY    . RETINAL DETACHMENT SURGERY        Home Medications    Prior to Admission medications   Medication Sig Start Date End Date Taking? Authorizing Provider  doxycycline (VIBRAMYCIN) 100 MG capsule Take 1 capsule (100 mg total) by mouth 2 (two) times daily. 03/19/16   Vanetta Mulders, MD  naproxen (NAPROSYN) 500 MG tablet Take 1 tablet (500 mg total) by mouth 2 (two) times daily. 03/19/16   Vanetta Mulders, MD  orphenadrine (NORFLEX) 100 MG tablet Take 1 tablet (100 mg total) by mouth 2 (two) times daily. 03/30/15   Arby Barrette, MD  predniSONE (DELTASONE) 20 MG tablet 3 tabs po day one, then 2 po daily x 4 days 03/30/15   Arby Barrette, MD  timolol (TIMOPTIC) 0.25 % ophthalmic solution Place 1 drop into both eyes 2 (two) times daily.    Historical Provider, MD  traMADol (ULTRAM) 50 MG tablet Take 1 tablet (50 mg total) by mouth every 6 (six) hours as needed. 03/30/15   Arby Barrette, MD  trimethoprim-polymyxin b (POLYTRIM) ophthalmic solution Place 1 drop into the left eye 4 (four) times  daily.      Historical Provider, MD    Family History No family history on file.  Social History Social History  Substance Use Topics  . Smoking status: Never Smoker  . Smokeless tobacco: Never Used  . Alcohol use No     Allergies   Patient has no known allergies.   Review of Systems Review of Systems  Constitutional: Negative for chills and fever.  Respiratory: Negative for cough and shortness of breath.   Cardiovascular: Positive for leg swelling. Negative for chest pain.  Gastrointestinal: Negative for vomiting.  Skin: Positive for wound (blister).  All other systems reviewed and are negative.    Physical Exam Updated Vital Signs There were no vitals taken for this  visit.  Physical Exam  Constitutional: He is oriented to person, place, and time. He appears well-developed and well-nourished.  Morbidly obese  HENT:  Head: Normocephalic and atraumatic.  Mouth/Throat: Oropharynx is clear and moist.  Eyes: Conjunctivae and EOM are normal. Pupils are equal, round, and reactive to light.  Neck: Normal range of motion.  Cardiovascular: Normal rate, regular rhythm and normal heart sounds.   Pulmonary/Chest: Effort normal and breath sounds normal. He has no wheezes. He has no rhonchi.  No distress, normal work of breathing, no wheezes or rhonchi, speaking in full sentences without difficulty  Abdominal: Soft. Bowel sounds are normal.  Musculoskeletal: Normal range of motion.  Right leg with large blister noted to right shin with smaller adjacent blister; these appear intact without active drainage; some mild erythema noted to right lower leg with warmth to touch; Peripheral edema of the ankles bilaterally (right slightly worse than left); DP pulses intact No significant calf asymmetry or palpable cords; no calf tenderness  Neurological: He is alert and oriented to person, place, and time.  Skin: Skin is warm and dry.  Psychiatric: He has a normal mood and affect.  Nursing note and vitals reviewed.      ED Treatments / Results  DIAGNOSTIC STUDIES: Oxygen Saturation is 93% on , low by my interpretation.    COORDINATION OF CARE: 10:10 PM- Pt advised of plan for treatment and pt agrees. Pt will receive lab work, chest x-ray, and EKG for further evaluation.    Labs (all labs ordered are listed, but only abnormal results are displayed) Labs Reviewed  BASIC METABOLIC PANEL - Abnormal; Notable for the following:       Result Value   Chloride 100 (*)    CO2 35 (*)    Calcium 8.8 (*)    All other components within normal limits  CBC - Abnormal; Notable for the following:    RBC 6.30 (*)    Hemoglobin 18.7 (*)    HCT 61.1 (*)    RDW 17.7 (*)    All  other components within normal limits  TROPONIN I  BRAIN NATRIURETIC PEPTIDE    EKG  EKG Interpretation None       Radiology Dg Chest 2 View  Result Date: 10/14/2016 CLINICAL DATA:  Initial evaluation for acute shortness of breath. EXAM: CHEST  2 VIEW COMPARISON:  Prior radiograph from 05/18/2016. FINDINGS: Moderate cardiomegaly.  Mediastinal silhouette within normal limits. Lungs normally inflated. Diffuse vascular congestion with interstitial prominence, suggesting mild pulmonary edema. No pleural effusion. No consolidative airspace disease. The no pneumothorax. No acute osseus abnormality. IMPRESSION: Cardiomegaly with diffuse pulmonary vascular congestion and interstitial prominence, suggesting mild pulmonary edema. Electronically Signed   By: Rise Mu M.D.   On: 10/14/2016 23:07  [place] Venous Img Lower Unilateral Right  Result Date: 10/14/2016 CLINICAL DATA:  Initial evaluation for right calf blistering and swelling for 1-2 days. EXAM: Right LOWER EXTREMITY VENOUS DOPPLER ULTRASOUND TECHNIQUE: Gray-scale sonography with graded compression, as well as color Doppler and duplex ultrasound were performed to evaluate the lower extremity deep venous systems from the level of the common femoral vein and including the common femoral, femoral, profunda femoral, popliteal and calf veins including the posterior tibial, peroneal and gastrocnemius veins when visible. The superficial great saphenous vein was also interrogated. Spectral Doppler was utilized to evaluate flow at rest and with distal augmentation maneuvers in the common femoral, femoral and popliteal veins. COMPARISON:  None. FINDINGS: Contralateral Common Femoral Vein: Respiratory phasicity is normal and symmetric with the symptomatic side. No evidence of thrombus. Normal compressibility. Common Femoral Vein: No evidence of thrombus. Normal compressibility, respiratory phasicity and response to augmentation. Saphenofemoral  Junction: No evidence of thrombus. Normal compressibility and flow on color Doppler imaging. Profunda Femoral Vein: No evidence of thrombus. Normal compressibility and flow on color Doppler imaging. Femoral Vein: No evidence of thrombus. Normal compressibility, respiratory phasicity and response to augmentation. Popliteal Vein: No evidence of thrombus. Normal compressibility, respiratory phasicity and response to augmentation. Calf Veins: No evidence of thrombus. Normal compressibility and flow on color Doppler imaging. Superficial Great Saphenous Vein: No evidence of thrombus. Normal compressibility and flow on color Doppler imaging. Venous Reflux:  None. Other Findings:  None. IMPRESSION: No evidence of deep venous thrombosis. Electronically Signed   By: Rise MuBenjamin  McClintock M.D.   On: 10/14/2016 23:06    Procedures Procedures (including critical care time)  Medications Ordered in ED Medications - No data to display   Initial Impression / Assessment and Plan / ED Course  Sharilyn SitesLisa Derran Sear, PA-C has reviewed the triage vital signs and the nursing notes.  Pertinent labs & imaging results that were available during my care of the patient were reviewed by me and considered in my medical decision making (see chart for details).  Clinical Course    30 year old male here with right lower leg pain with redness and blister.  States he noticed this after having his leg pushed up against a heated seat on a car ride. Patient is afebrile and nontoxic. He is in no acute distress. Blister of right lower leg is intact without any noted drainage. There is surrounding erythema and some warmth to touch. He has peripheral edema of ankles bilaterally. No significant calf asymmetry, tenderness, or palpable cords.  During assessment patient's O2 sats between 83-85% on RA.  He adamantly denies any shortness of breath, chest pain, cough, or other upper respiratory symptoms.  He is in no apparent respiratory distress, states  this is how he always feels.  States his baseline O2 sats are between 88-93%. He does use supplemental O2 PRN at home, usually 2-3L when needed.  Patient's O2 sats corrected to 95% with supplemental O2.  Given these findings, will obtain duplex right leg, CXR, labs.  Patient remain stable at this time.    Labs overall reassuring.  Trop and BNP WNL.  CXR with mild pulmonary vascular congestion.  Venous duplex negative.  Patient has remained stable here on supplemental 2L which he uses at home.  He continues to feel well without any complaints of chest pain or SOB.  Given that patient is asymptomatic and his vital signs are stable on supplemental O2 which he has at home for personal use, feel it is reasonable to let him  go home (this is likely his baseline).  Patient is happy about this as he does not want to be admitted as he states he just came in for his leg. In regards to that, will not debride blister at this time as I feel it may have been his chance of infection. He does have some mild erythema surrounding and some warmth to touch, so will start on antibiotics for potential developing cellulitis.  Has responded well to doxycycline in the past. He has been scheduled follow-up with his pulmonologist next Friday for his sleep study, recommended to discuss this ED visit with him.  Will have him double his lasix for the next 3 days to help with vascular congestion.  I recommend that he follow-up with his primary care doctor next week for re-check of the leg as well.  Discussed plan with patient, he/she acknowledged understanding and agreed with plan of care.  Return precautions given for new or worsening symptoms.  Case discussed with attending physician, Dr. Juleen ChinaKohut, who agrees with assessment and plan of care.  Final Clinical Impressions(s) / ED Diagnoses   Final diagnoses:  Blister of right lower extremity, initial encounter  Pulmonary vascular congestion    New Prescriptions New Prescriptions    DOXYCYCLINE (VIBRAMYCIN) 100 MG CAPSULE    Take 1 capsule (100 mg total) by mouth 2 (two) times daily.   I personally performed the services described in this documentation, which was scribed in my presence. The recorded information has been reviewed and is accurate.   Garlon HatchetLisa M Rielle Schlauch, PA-C 10/15/16 0006    Raeford RazorStephen Kohut, MD 10/18/16 1115

## 2016-10-14 NOTE — ED Notes (Addendum)
Patient transported to X-ray 

## 2016-10-14 NOTE — ED Notes (Signed)
Patient transported to Ultrasound 

## 2016-10-14 NOTE — Discharge Instructions (Signed)
Take the prescribed medication as directed.  This should help with your leg.  Recommend to keep legs clean and dry.  Do not pop blister, it is better to let this happen on its own.  You likely will notice some clear drainage when/if this happens. Recommend to increase your home lasix to 40mg  (2 tablets) daily for the next 3 days.  This will help with the fluid in your lungs. Follow-up with your primary care doctor to have them re-check your leg next week. Keep your appt for your sleep study next Friday. Return to the ED for new or worsening symptoms-- increased swelling of the legs, high fever, chills, sweats, chest pain, Shortness of breath, etc.

## 2018-11-07 ENCOUNTER — Inpatient Hospital Stay (HOSPITAL_COMMUNITY)
Admission: EM | Admit: 2018-11-07 | Discharge: 2018-11-11 | DRG: 202 | Disposition: A | Discharge status: Home / Self Care | Payer: BLUE CROSS/BLUE SHIELD | Attending: Internal Medicine | Admitting: Internal Medicine

## 2018-11-07 ENCOUNTER — Observation Stay (HOSPITAL_COMMUNITY): Payer: BLUE CROSS/BLUE SHIELD

## 2018-11-07 ENCOUNTER — Other Ambulatory Visit: Payer: Self-pay

## 2018-11-07 ENCOUNTER — Emergency Department (HOSPITAL_COMMUNITY): Payer: BLUE CROSS/BLUE SHIELD

## 2018-11-07 ENCOUNTER — Encounter (HOSPITAL_COMMUNITY): Payer: Self-pay | Admitting: Emergency Medicine

## 2018-11-07 DIAGNOSIS — R945 Abnormal results of liver function studies: Secondary | ICD-10-CM

## 2018-11-07 DIAGNOSIS — E875 Hyperkalemia: Secondary | ICD-10-CM | POA: Diagnosis present

## 2018-11-07 DIAGNOSIS — R079 Chest pain, unspecified: Secondary | ICD-10-CM | POA: Insufficient documentation

## 2018-11-07 DIAGNOSIS — R0689 Other abnormalities of breathing: Secondary | ICD-10-CM

## 2018-11-07 DIAGNOSIS — R7989 Other specified abnormal findings of blood chemistry: Secondary | ICD-10-CM

## 2018-11-07 DIAGNOSIS — Z9981 Dependence on supplemental oxygen: Secondary | ICD-10-CM

## 2018-11-07 DIAGNOSIS — N179 Acute kidney failure, unspecified: Secondary | ICD-10-CM | POA: Diagnosis present

## 2018-11-07 DIAGNOSIS — Z9119 Patient's noncompliance with other medical treatment and regimen: Secondary | ICD-10-CM

## 2018-11-07 DIAGNOSIS — I2729 Other secondary pulmonary hypertension: Secondary | ICD-10-CM | POA: Diagnosis present

## 2018-11-07 DIAGNOSIS — E119 Type 2 diabetes mellitus without complications: Secondary | ICD-10-CM

## 2018-11-07 DIAGNOSIS — D751 Secondary polycythemia: Secondary | ICD-10-CM | POA: Diagnosis present

## 2018-11-07 DIAGNOSIS — J9621 Acute and chronic respiratory failure with hypoxia: Secondary | ICD-10-CM | POA: Diagnosis present

## 2018-11-07 DIAGNOSIS — J208 Acute bronchitis due to other specified organisms: Secondary | ICD-10-CM | POA: Diagnosis not present

## 2018-11-07 DIAGNOSIS — R911 Solitary pulmonary nodule: Secondary | ICD-10-CM | POA: Diagnosis present

## 2018-11-07 DIAGNOSIS — K8020 Calculus of gallbladder without cholecystitis without obstruction: Secondary | ICD-10-CM | POA: Diagnosis present

## 2018-11-07 DIAGNOSIS — J9622 Acute and chronic respiratory failure with hypercapnia: Secondary | ICD-10-CM | POA: Diagnosis present

## 2018-11-07 DIAGNOSIS — T502X5A Adverse effect of carbonic-anhydrase inhibitors, benzothiadiazides and other diuretics, initial encounter: Secondary | ICD-10-CM | POA: Diagnosis present

## 2018-11-07 DIAGNOSIS — Z86718 Personal history of other venous thrombosis and embolism: Secondary | ICD-10-CM

## 2018-11-07 DIAGNOSIS — I878 Other specified disorders of veins: Secondary | ICD-10-CM | POA: Diagnosis present

## 2018-11-07 DIAGNOSIS — I2781 Cor pulmonale (chronic): Secondary | ICD-10-CM | POA: Diagnosis present

## 2018-11-07 DIAGNOSIS — R0602 Shortness of breath: Secondary | ICD-10-CM | POA: Diagnosis not present

## 2018-11-07 DIAGNOSIS — J9601 Acute respiratory failure with hypoxia: Secondary | ICD-10-CM | POA: Diagnosis not present

## 2018-11-07 DIAGNOSIS — I160 Hypertensive urgency: Secondary | ICD-10-CM | POA: Diagnosis present

## 2018-11-07 DIAGNOSIS — Z6841 Body Mass Index (BMI) 40.0 and over, adult: Secondary | ICD-10-CM

## 2018-11-07 DIAGNOSIS — R0902 Hypoxemia: Secondary | ICD-10-CM

## 2018-11-07 DIAGNOSIS — E662 Morbid (severe) obesity with alveolar hypoventilation: Secondary | ICD-10-CM | POA: Diagnosis present

## 2018-11-07 DIAGNOSIS — I82409 Acute embolism and thrombosis of unspecified deep veins of unspecified lower extremity: Secondary | ICD-10-CM | POA: Diagnosis present

## 2018-11-07 DIAGNOSIS — I5033 Acute on chronic diastolic (congestive) heart failure: Secondary | ICD-10-CM | POA: Diagnosis present

## 2018-11-07 DIAGNOSIS — I509 Heart failure, unspecified: Secondary | ICD-10-CM

## 2018-11-07 DIAGNOSIS — I110 Hypertensive heart disease with heart failure: Secondary | ICD-10-CM | POA: Diagnosis present

## 2018-11-07 DIAGNOSIS — R0603 Acute respiratory distress: Secondary | ICD-10-CM

## 2018-11-07 DIAGNOSIS — J209 Acute bronchitis, unspecified: Secondary | ICD-10-CM | POA: Diagnosis present

## 2018-11-07 DIAGNOSIS — J440 Chronic obstructive pulmonary disease with acute lower respiratory infection: Secondary | ICD-10-CM | POA: Diagnosis present

## 2018-11-07 DIAGNOSIS — J441 Chronic obstructive pulmonary disease with (acute) exacerbation: Secondary | ICD-10-CM | POA: Diagnosis present

## 2018-11-07 DIAGNOSIS — Z79899 Other long term (current) drug therapy: Secondary | ICD-10-CM

## 2018-11-07 DIAGNOSIS — K828 Other specified diseases of gallbladder: Secondary | ICD-10-CM | POA: Diagnosis present

## 2018-11-07 DIAGNOSIS — R0600 Dyspnea, unspecified: Secondary | ICD-10-CM

## 2018-11-07 DIAGNOSIS — I5032 Chronic diastolic (congestive) heart failure: Secondary | ICD-10-CM | POA: Diagnosis present

## 2018-11-07 DIAGNOSIS — I10 Essential (primary) hypertension: Secondary | ICD-10-CM | POA: Diagnosis present

## 2018-11-07 DIAGNOSIS — I890 Lymphedema, not elsewhere classified: Secondary | ICD-10-CM | POA: Diagnosis present

## 2018-11-07 HISTORY — DX: Acute embolism and thrombosis of unspecified deep veins of unspecified lower extremity: I82.409

## 2018-11-07 HISTORY — DX: Heart failure, unspecified: I50.9

## 2018-11-07 HISTORY — DX: Pulmonary hypertension, unspecified: I27.20

## 2018-11-07 HISTORY — DX: Acute respiratory failure with hypoxia: J96.01

## 2018-11-07 HISTORY — DX: Chronic obstructive pulmonary disease, unspecified: J44.9

## 2018-11-07 LAB — CBC WITH DIFFERENTIAL/PLATELET
Abs Immature Granulocytes: 0.02 10*3/uL (ref 0.00–0.07)
BASOS PCT: 0 %
Basophils Absolute: 0 10*3/uL (ref 0.0–0.1)
Eosinophils Absolute: 0 10*3/uL (ref 0.0–0.5)
Eosinophils Relative: 0 %
HCT: 66.1 % — ABNORMAL HIGH (ref 39.0–52.0)
Hemoglobin: 19.6 g/dL — ABNORMAL HIGH (ref 13.0–17.0)
Immature Granulocytes: 0 %
Lymphocytes Relative: 15 %
Lymphs Abs: 1.5 10*3/uL (ref 0.7–4.0)
MCH: 29.9 pg (ref 26.0–34.0)
MCHC: 29.7 g/dL — ABNORMAL LOW (ref 30.0–36.0)
MCV: 100.9 fL — ABNORMAL HIGH (ref 80.0–100.0)
MONOS PCT: 6 %
Monocytes Absolute: 0.6 10*3/uL (ref 0.1–1.0)
Neutro Abs: 7.8 10*3/uL — ABNORMAL HIGH (ref 1.7–7.7)
Neutrophils Relative %: 79 %
Platelets: 154 10*3/uL (ref 150–400)
RBC: 6.55 MIL/uL — ABNORMAL HIGH (ref 4.22–5.81)
RDW: 17.2 % — ABNORMAL HIGH (ref 11.5–15.5)
WBC: 10 10*3/uL (ref 4.0–10.5)
nRBC: 0.4 % — ABNORMAL HIGH (ref 0.0–0.2)

## 2018-11-07 LAB — COMPREHENSIVE METABOLIC PANEL
ALBUMIN: 2.9 g/dL — AB (ref 3.5–5.0)
ALT: 54 U/L — AB (ref 0–44)
AST: 48 U/L — AB (ref 15–41)
Alkaline Phosphatase: 62 U/L (ref 38–126)
Anion gap: 9 (ref 5–15)
BILIRUBIN TOTAL: 0.8 mg/dL (ref 0.3–1.2)
BUN: 16 mg/dL (ref 6–20)
CO2: 35 mmol/L — ABNORMAL HIGH (ref 22–32)
CREATININE: 1.28 mg/dL — AB (ref 0.61–1.24)
Calcium: 8.9 mg/dL (ref 8.9–10.3)
Chloride: 98 mmol/L (ref 98–111)
GFR calc Af Amer: >60 mL/min (ref >60)
GLUCOSE: 94 mg/dL (ref 70–99)
Potassium: 4.5 mmol/L (ref 3.5–5.1)
SODIUM: 142 mmol/L (ref 135–145)
TOTAL PROTEIN: 7.1 g/dL (ref 6.5–8.1)

## 2018-11-07 LAB — TROPONIN I: Troponin I: 0.12 ng/mL (ref <0.03)

## 2018-11-07 LAB — URINALYSIS, ROUTINE W REFLEX MICROSCOPIC
Bilirubin Urine: NEGATIVE
Glucose, UA: NEGATIVE mg/dL
Hgb urine dipstick: NEGATIVE
Ketones, ur: NEGATIVE mg/dL
Nitrite: NEGATIVE
Protein, ur: 100 mg/dL — AB
SPECIFIC GRAVITY, URINE: 1.018 (ref 1.005–1.030)
pH: 6 (ref 5.0–8.0)

## 2018-11-07 LAB — INFLUENZA PANEL BY PCR (TYPE A & B)
INFLAPCR: NEGATIVE
Influenza B By PCR: NEGATIVE

## 2018-11-07 LAB — I-STAT CG4 LACTIC ACID, ED: Lactic Acid, Venous: 1.45 mmol/L (ref 0.5–1.9)

## 2018-11-07 LAB — LIPASE, BLOOD: Lipase: 29 U/L (ref 11–51)

## 2018-11-07 LAB — I-STAT TROPONIN, ED: Troponin i, poc: 0.11 ng/mL (ref 0.00–0.08)

## 2018-11-07 LAB — D-DIMER, QUANTITATIVE: D-Dimer, Quant: 1.34 ug/mL-FEU — ABNORMAL HIGH (ref 0.00–0.50)

## 2018-11-07 LAB — PROTIME-INR
INR: 1.02
Prothrombin Time: 13.3 seconds (ref 11.4–15.2)

## 2018-11-07 LAB — BRAIN NATRIURETIC PEPTIDE: B NATRIURETIC PEPTIDE 5: 162.6 pg/mL — AB (ref 0.0–100.0)

## 2018-11-07 MED ORDER — NITROGLYCERIN IN D5W 200-5 MCG/ML-% IV SOLN
0.0000 ug/min | INTRAVENOUS | Status: DC
  Filled 2018-11-07: qty 250

## 2018-11-07 MED ORDER — NITROGLYCERIN 2 % TD OINT
1.0000 [in_us] | TOPICAL_OINTMENT | Freq: Four times a day (QID) | TRANSDERMAL | Status: DC
  Administered 2018-11-07 (×2): 1 [in_us] via TOPICAL
  Filled 2018-11-07 (×2): qty 1

## 2018-11-07 MED ORDER — METOPROLOL TARTRATE 100 MG PO TABS
100.0000 mg | ORAL_TABLET | Freq: Two times a day (BID) | ORAL | Status: DC
  Administered 2018-11-07 – 2018-11-11 (×8): 100 mg via ORAL
  Filled 2018-11-07 (×5): qty 1
  Filled 2018-11-07 (×2): qty 4
  Filled 2018-11-07: qty 1

## 2018-11-07 MED ORDER — ACETAMINOPHEN 325 MG PO TABS: 650.0000 mg | ORAL_TABLET | Freq: Four times a day (QID) | ORAL | Status: DC | PRN

## 2018-11-07 MED ORDER — IPRATROPIUM-ALBUTEROL 0.5-2.5 (3) MG/3ML IN SOLN
3.0000 mL | Freq: Four times a day (QID) | RESPIRATORY_TRACT | Status: DC
  Administered 2018-11-07 – 2018-11-08 (×3): 3 mL via RESPIRATORY_TRACT
  Filled 2018-11-07 (×3): qty 3

## 2018-11-07 MED ORDER — IOPAMIDOL (ISOVUE-370) INJECTION 76%
INTRAVENOUS | Status: AC
  Filled 2018-11-07: qty 100

## 2018-11-07 MED ORDER — FUROSEMIDE 10 MG/ML IJ SOLN
40.0000 mg | Freq: Two times a day (BID) | INTRAMUSCULAR | Status: DC
  Administered 2018-11-08 (×2): 40 mg via INTRAVENOUS
  Filled 2018-11-07 (×3): qty 4

## 2018-11-07 MED ORDER — ACETAMINOPHEN 650 MG RE SUPP: 650.0000 mg | Freq: Four times a day (QID) | RECTAL | Status: DC | PRN

## 2018-11-07 MED ORDER — ASPIRIN 81 MG PO CHEW
324.0000 mg | CHEWABLE_TABLET | Freq: Once | ORAL | Status: AC
  Administered 2018-11-07: 324 mg via ORAL
  Filled 2018-11-07: qty 4

## 2018-11-07 MED ORDER — DM-GUAIFENESIN ER 30-600 MG PO TB12
2.0000 | ORAL_TABLET | Freq: Two times a day (BID) | ORAL | Status: DC
  Administered 2018-11-07 – 2018-11-11 (×8): 2 via ORAL
  Filled 2018-11-07: qty 1
  Filled 2018-11-07 (×4): qty 2
  Filled 2018-11-07 (×3): qty 1

## 2018-11-07 MED ORDER — HEPARIN BOLUS VIA INFUSION
6000.0000 [IU] | Freq: Once | INTRAVENOUS | Status: AC
  Administered 2018-11-07: 6000 [IU] via INTRAVENOUS
  Filled 2018-11-07: qty 6000

## 2018-11-07 MED ORDER — FUROSEMIDE 10 MG/ML IJ SOLN
40.0000 mg | Freq: Once | INTRAMUSCULAR | Status: AC
  Administered 2018-11-07: 40 mg via INTRAVENOUS
  Filled 2018-11-07: qty 4

## 2018-11-07 MED ORDER — IOPAMIDOL (ISOVUE-370) INJECTION 76%
100.0000 mL | Freq: Once | INTRAVENOUS | Status: AC | PRN
  Administered 2018-11-07: 100 mL via INTRAVENOUS

## 2018-11-07 MED ORDER — METHYLPREDNISOLONE SODIUM SUCC 125 MG IJ SOLR
125.0000 mg | Freq: Once | INTRAMUSCULAR | Status: AC
  Administered 2018-11-07: 125 mg via INTRAVENOUS
  Filled 2018-11-07: qty 2

## 2018-11-07 MED ORDER — ENOXAPARIN SODIUM 40 MG/0.4ML ~~LOC~~ SOLN
40.0000 mg | Freq: Every day | SUBCUTANEOUS | Status: DC
  Administered 2018-11-08 – 2018-11-10 (×3): 40 mg via SUBCUTANEOUS
  Filled 2018-11-07 (×4): qty 0.4

## 2018-11-07 MED ORDER — INSULIN ASPART 100 UNIT/ML ~~LOC~~ SOLN
0.0000 [IU] | Freq: Three times a day (TID) | SUBCUTANEOUS | Status: DC
  Administered 2018-11-08 – 2018-11-10 (×5): 2 [IU] via SUBCUTANEOUS
  Administered 2018-11-10: 5 [IU] via SUBCUTANEOUS

## 2018-11-07 MED ORDER — HEPARIN (PORCINE) 25000 UT/250ML-% IV SOLN
1900.0000 [IU]/h | INTRAVENOUS | Status: DC
  Administered 2018-11-07: 1900 [IU]/h via INTRAVENOUS
  Filled 2018-11-07: qty 250

## 2018-11-07 NOTE — ED Notes (Signed)
Patient transported to X-ray 

## 2018-11-07 NOTE — ED Notes (Signed)
Patient transported to Ultrasound 

## 2018-11-07 NOTE — ED Triage Notes (Signed)
Pt arrives to ED from his MD's office with complaints of shortness of breath and chest pain x1 week. EMS reports pt was 78% on room air. Pt was placed on 4L [place] and now at 95%. Pt has hx of COPD. Pt placed in position of comfort with bed locked and lowered, call bell in reach.

## 2018-11-07 NOTE — ED Notes (Signed)
Patient transported to CT 

## 2018-11-07 NOTE — ED Provider Notes (Signed)
[facility] EMERGENCY DEPARTMENT Provider Note   CSN: [id number] Arrival date & time: 11/07/18  1154     History   Chief Complaint Chief Complaint  Patient presents with  . Shortness of Breath  . Chest Pain    HPI Edward Castro is a 33 y.o. male.  HPI Patient reports that he started to get a cold about a week ago.  Symptoms started with nasal congestion and sore throat.  He felt it was a typical cold.  He did not start with a cough immediately.  He reports it was unusual because he started to get short of breath a few days ago and then started getting a cough.  Cough is nonproductive.  He reports also, his heart has been beating hard and fast.  He reports he is having chest pain in the center of his chest.  He reports chest pain comes with exertion and coughing.  He does not have chest pain at rest.  Patient has history of DVT.  He reports his legs are always swollen.  He has not noted an increase in swelling or pain.  He has not been on blood thinners for 5 or 6 years.  Patient is a non-smoker.  On review of systems, patient always has drainage and crusting around his eyes.  This is not a new finding. Chronic swelling of the lower legs bilaterally. Past Medical History:  Diagnosis Date  . CHF (congestive heart failure) (HCC)   . COPD (chronic obstructive pulmonary disease) (HCC)   . Diabetes mellitus without complication (HCC)   . Hypertension   . Pulmonary hypertension (HCC)     There are no active problems to display for this patient.   Past Surgical History:  Procedure Laterality Date  . CATARACT EXTRACTION    . EYE SURGERY    . RETINAL DETACHMENT SURGERY          Home Medications    Prior to Admission medications   Medication Sig Start Date End Date Taking? Authorizing Provider  doxycycline (VIBRAMYCIN) 100 MG capsule Take 1 capsule (100 mg total) by mouth 2 (two) times daily. 10/14/16   Garlon Hatchet, PA-C  furosemide (LASIX) 20 MG tablet  Take 20 mg by mouth.    [provider]  naproxen (NAPROSYN) 500 MG tablet Take 1 tablet (500 mg total) by mouth 2 (two) times daily. 03/19/16   Vanetta Mulders, MD  orphenadrine (NORFLEX) 100 MG tablet Take 1 tablet (100 mg total) by mouth 2 (two) times daily. 03/30/15   Arby Barrette, MD  predniSONE (DELTASONE) 20 MG tablet 3 tabs po day one, then 2 po daily x 4 days 03/30/15   Arby Barrette, MD  timolol (TIMOPTIC) 0.25 % ophthalmic solution Place 1 drop into both eyes 2 (two) times daily.    [provider]  traMADol (ULTRAM) 50 MG tablet Take 1 tablet (50 mg total) by mouth every 6 (six) hours as needed. 03/30/15   Arby Barrette, MD  trimethoprim-polymyxin b (POLYTRIM) ophthalmic solution Place 1 drop into the left eye 4 (four) times daily.      [provider]    Family History History reviewed. No pertinent family history.  Social History Social History   Tobacco Use  . Smoking status: Never Smoker  . Smokeless tobacco: Never Used  Substance Use Topics  . Alcohol use: No  . Drug use: No     Allergies   Patient has no known allergies.   Review of Systems  Review of Systems 10 Systems reviewed and are negative for acute change except as noted in the HPI.  Physical Exam Updated Vital Signs BP (!) 160/103   Pulse (!) 101   Resp 14   Ht 5\' 8"  (1.727 m)   Wt (!) 159.2 kg   SpO2 96%   BMI 53.37 kg/m   Physical Exam Constitutional:      Comments: Patient is alert and interactive.  Nontoxic.  Mild tachypnea at rest.  Obesity.  HENT:     Head: Normocephalic and atraumatic.     Comments: Mucous membranes are pink and moist. Eyes:     Comments: Patient has crusty drainage around both eyes.  He reports this is a chronic condition.  Mild injection of the sclera bilaterally.  Neck:     Musculoskeletal: Neck supple.  Cardiovascular:     Comments: Tachycardia.  Cannot appreciate rub or murmur but seems to have S3 gallop. Pulmonary:      Comments: Mild tachypnea.  Some coarse rhonchi both lung fields.  Decreased breath sounds at bases. Abdominal:     Comments: Abdomen is soft and nontender obese.  Musculoskeletal:     Comments: 2+ pitting edema bilateral lower extremities.  Skin thinning and cobblestoning consistent with chronic venous stasis.  Skin:    General: Skin is warm and dry.  Neurological:     Comments: Patient is alert and appropriate.  Mental status normal.  No focal neurologic deficit.  Psychiatric:        Mood and Affect: Mood normal.      ED Treatments / Results  Labs (all labs ordered are listed, but only abnormal results are displayed) Labs Reviewed  COMPREHENSIVE METABOLIC PANEL - Abnormal; Notable for the following components:      Result Value   CO2 35 (*)    Creatinine, Ser 1.28 (*)    Albumin 2.9 (*)    AST 48 (*)    ALT 54 (*)    All other components within normal limits  BRAIN NATRIURETIC PEPTIDE - Abnormal; Notable for the following components:   B Natriuretic Peptide 162.6 (*)    All other components within normal limits  CBC WITH DIFFERENTIAL/PLATELET - Abnormal; Notable for the following components:   RBC 6.55 (*)    Hemoglobin 19.6 (*)    HCT 66.1 (*)    MCV 100.9 (*)    MCHC 29.7 (*)    RDW 17.2 (*)    nRBC 0.4 (*)    Neutro Abs 7.8 (*)    All other components within normal limits  URINALYSIS, ROUTINE W REFLEX MICROSCOPIC - Abnormal; Notable for the following components:   Protein, ur 100 (*)    Leukocytes, UA TRACE (*)    Bacteria, UA RARE (*)    All other components within normal limits  D-DIMER, QUANTITATIVE (NOT AT [facility]) - Abnormal; Notable for the following components:   D-Dimer, Quant 1.34 (*)    All other components within normal limits  I-STAT TROPONIN, ED - Abnormal; Notable for the following components:   Troponin i, poc 0.11 (*)    All other components within normal limits  CULTURE, BLOOD (ROUTINE X 2)  CULTURE, BLOOD (ROUTINE X 2)  LIPASE, BLOOD  INFLUENZA  PANEL BY PCR (TYPE A & B)  PROTIME-INR  HEPARIN LEVEL (UNFRACTIONATED)  I-STAT CG4 LACTIC ACID, ED    EKG EKG Interpretation  Date/Time:  Wednesday November 07 2018 12:01:00 EST Ventricular Rate:  115 PR Interval:    QRS Duration:  88 QT Interval:  328 QTC Calculation: 454 R Axis:   152 Text Interpretation:  Sinus tachycardia LAE, consider biatrial enlargement Right ventricular hypertrophy Borderline T abnormalities, diffuse leads No significant change since last tracing Confirmed by Linwood DibblesKnapp, Jon [phone]) on 11/07/2018 3:52:35 PM   Radiology Dg Chest 2 View  Result Date: 11/07/2018 CLINICAL DATA:  Shortness of breath and chest pain EXAM: CHEST - 2 VIEW COMPARISON:  October 14, 2016 FINDINGS: Degree of inspiration is shallow. There is bibasilar atelectasis. There is no edema or consolidation. There is cardiomegaly with pulmonary vascularity normal. No adenopathy. No bone lesions. IMPRESSION: Bibasilar atelectasis. No frank edema or consolidation. There is cardiomegaly. Electronically Signed   By: Bretta BangWilliam  Woodruff III M.D.   On: 11/07/2018 14:33    Procedures Procedures (including critical care time) CRITICAL CARE Performed by: Arby BarretteMarcy Ehren Berisha   Total critical care time: 45 minutes  Critical care time was exclusive of separately billable procedures and treating other patients.  Critical care was necessary to treat or prevent imminent or life-threatening deterioration.  Critical care was time spent personally by me on the following activities: development of treatment plan with patient and/or surrogate as well as nursing, discussions with consultants, evaluation of patient's response to treatment, examination of patient, obtaining history from patient or surrogate, ordering and performing treatments and interventions, ordering and review of laboratory studies, ordering and review of radiographic studies, pulse oximetry and re-evaluation of patient's condition.  Medications Ordered in  ED Medications  nitroGLYCERIN (NITROGLYN) 2 % ointment 1 inch (1 inch Topical Given 11/07/18 1515)  iopamidol (ISOVUE-370) 76 % injection (has no administration in time range)  nitroGLYCERIN 50 mg in dextrose 5 % 250 mL (0.2 mg/mL) infusion (0 mcg/min Intravenous Hold 11/07/18 1606)  heparin ADULT infusion 100 units/mL (25000 units/27150mL sodium chloride 0.45%) (1,900 Units/hr Intravenous New Bag/Given 11/07/18 1617)  aspirin chewable tablet 324 mg (324 mg Oral Given 11/07/18 1333)  heparin bolus via infusion 6,000 Units (6,000 Units Intravenous Bolus from Bag 11/07/18 1618)     Initial Impression / Assessment and Plan / ED Course  I have reviewed the triage vital signs and the nursing notes.  Pertinent labs & imaging results that were available during my care of the patient were reviewed by me and considered in my medical decision making (see chart for details).    Patient presents with symptoms that initially were upper respiratory.  However he has developed significant shortness of breath and now hypoxia.  Patient also has chest pain.  Patient has significant history of DVT.  At this time, CT PE study is pending.  I do have high suspicion and differential diagnosis for PE and will empirically start heparin at this time.  Patient was also hypertensive.  With nitroglycerin blood pressures have come down to 150s over 100.  This will need to continue to be monitored.  Hypertensive emergency is also a consideration.  Patient's plain film chest x-ray does not show gross fluid overload but he has history of congestive heart failure and peripheral edema.  Dr. Iantha FallenJohn Knapp assumed care for the patient at shift change.  He has been updated on blood pressure response and nitroglycerin paste.  At this time, will hold ordered nitroglycerin drip and initiated if blood pressures elevated again.  Patient currently is chest pain-free.  Dr. Lynelle DoctorKnapp will follow-up on PE study for final determination of etiology of patient's  symptoms.  Patient will need admission.   Final Clinical Impressions(s) / ED Diagnoses   Final diagnoses:  Acute respiratory distress  Hypoxia  Essential hypertension  History of DVT of lower extremity    ED Discharge Orders    None       Arby Barrette, MD 11/07/18 1623

## 2018-11-07 NOTE — H&P (Signed)
History and Physical    Que Meneely HWT:[id number] DOB: Jun 03, 1986 DOA: 11/07/2018  PCP: System, Pcp Not In Patient coming from: Home  Chief Complaint: Shortness of breath, chest pain  HPI: Edward Castro is a 33 y.o. male with medical history significant of CHF, COPD, type 2 diabetes, essential hypertension, pulmonary hypertension presenting to the hospital via EMS for evaluation of shortness of breath and chest pain.  Per EMS report, oxygen saturation 78% on room air.  He was placed on 4 L oxygen via nasal cannula.  Patient reports having dyspnea on exertion, wheezing, and a nonproductive cough for the past few days.  He thinks he may have had a fever at home.  It is unclear if he is having orthopnea.  Reports having chronic bilateral lower extremity edema.  States he has been taking Lasix at home.  Denies any dietary indiscretions such as increased sodium or fluid intake.  Reports having an episode of chest pain a week ago while walking which resolved in 10 minutes with rest and taking an aspirin.  He describes the chest pain as substernal, nonradiating, and feels like tightness.  States he has had a few more intermittent episodes of chest pain since then.  Last episode was 1 day ago.  He is not having any chest pain at present.  Review of Systems: As per HPI otherwise 10 point review of systems negative.  Past Medical History:  Diagnosis Date  . CHF (congestive heart failure) ([place])   . COPD (chronic obstructive pulmonary disease) ([place])   . Diabetes mellitus without complication ([place])   . Hypertension   . Pulmonary hypertension ([place])     Past Surgical History:  Procedure Laterality Date  . CATARACT EXTRACTION    . EYE SURGERY    . RETINAL DETACHMENT SURGERY       reports that he has never smoked. He has never used smokeless tobacco. He reports that he does not drink alcohol or use drugs.  No Known Allergies  History reviewed. No pertinent family history.  Prior to Admission  medications   Medication Sig Start Date End Date Taking? Authorizing Provider  furosemide (LASIX) 40 MG tablet Take 40 mg by mouth daily.    Yes [provider]  metFORMIN (GLUCOPHAGE) 500 MG tablet Take 500 mg by mouth daily. 04/24/18  Yes [provider]  metoprolol tartrate (LOPRESSOR) 100 MG tablet Take 100 mg by mouth 2 (two) times daily. 11/01/16  Yes [provider]  doxycycline (VIBRAMYCIN) 100 MG capsule Take 1 capsule (100 mg total) by mouth 2 (two) times daily. Patient not taking: Reported on 11/07/2018 10/14/16   Larene Pickett, PA-C  naproxen (NAPROSYN) 500 MG tablet Take 1 tablet (500 mg total) by mouth 2 (two) times daily. Patient not taking: Reported on 11/07/2018 03/19/16   Fredia Sorrow, MD  orphenadrine (NORFLEX) 100 MG tablet Take 1 tablet (100 mg total) by mouth 2 (two) times daily. Patient not taking: Reported on 11/07/2018 03/30/15   Charlesetta Shanks, MD  predniSONE (DELTASONE) 20 MG tablet 3 tabs po day one, then 2 po daily x 4 days Patient not taking: Reported on 11/07/2018 03/30/15   Charlesetta Shanks, MD  traMADol (ULTRAM) 50 MG tablet Take 1 tablet (50 mg total) by mouth every 6 (six) hours as needed. Patient not taking: Reported on 11/07/2018 03/30/15   Charlesetta Shanks, MD    Physical Exam: Vitals:   11/07/18 1745 11/07/18 1800 11/07/18 1815 11/07/18 1830  BP: 135/86 (!) 140/92 135/73 130/76  Pulse: 98 (!) 103 99 (!) 101  Resp: 19   10  SpO2: 94% (!) 85% 94% 91%  Weight:      Height:        Physical Exam  Constitutional: He is oriented to person, place, and time. He appears well-developed and well-nourished. No distress.  Morbidly obese  HENT:  Head: Normocephalic and atraumatic.  Mouth/Throat: Oropharynx is clear and moist.  Eyes: Right eye exhibits no discharge. Left eye exhibits no discharge.  Neck: Neck supple.  Cardiovascular: Normal rate, regular rhythm and intact distal pulses.  Pulmonary/Chest: He has no wheezes. He has rales.    Speaking clearly in full sentences Mild bibasilar rales Decreased breath sounds appreciated due to patient's large body habitus  Abdominal: Soft. Bowel sounds are normal. There is no abdominal tenderness. There is no guarding.  Musculoskeletal:        General: No edema.  Neurological: He is alert and oriented to person, place, and time.  Skin: Skin is warm and dry. He is not diaphoretic.     Labs on Admission: I have personally reviewed following labs and imaging studies  CBC: Recent Labs  Lab 11/07/18 1246  WBC 10.0  NEUTROABS 7.8*  HGB 19.6*  HCT 66.1*  MCV 100.9*  PLT 491   Basic Metabolic Panel: Recent Labs  Lab 11/07/18 1246  NA 142  K 4.5  CL 98  CO2 35*  GLUCOSE 94  BUN 16  CREATININE 1.28*  CALCIUM 8.9   GFR: Estimated Creatinine Clearance: 121.6 mL/min (A) (by C-G formula based on SCr of 1.28 mg/dL (H)). Liver Function Tests: Recent Labs  Lab 11/07/18 1246  AST 48*  ALT 54*  ALKPHOS 62  BILITOT 0.8  PROT 7.1  ALBUMIN 2.9*   Recent Labs  Lab 11/07/18 1246  LIPASE 29   No results for input(s): AMMONIA in the last 168 hours. Coagulation Profile: Recent Labs  Lab 11/07/18 1500  INR 1.02   Cardiac Enzymes: No results for input(s): CKTOTAL, CKMB, CKMBINDEX, TROPONINI in the last 168 hours. BNP (last 3 results) No results for input(s): PROBNP in the last 8760 hours. HbA1C: No results for input(s): HGBA1C in the last 72 hours. CBG: No results for input(s): GLUCAP in the last 168 hours. Lipid Profile: No results for input(s): CHOL, HDL, LDLCALC, TRIG, CHOLHDL, LDLDIRECT in the last 72 hours. Thyroid Function Tests: No results for input(s): TSH, T4TOTAL, FREET4, T3FREE, THYROIDAB in the last 72 hours. Anemia Panel: No results for input(s): VITAMINB12, FOLATE, FERRITIN, TIBC, IRON, RETICCTPCT in the last 72 hours. Urine analysis:    Component Value Date/Time   COLORURINE YELLOW 11/07/2018 [place] 11/07/2018 1518    LABSPEC 1.018 11/07/2018 1518   PHURINE 6.0 11/07/2018 1518   GLUCOSEU NEGATIVE 11/07/2018 1518   HGBUR NEGATIVE 11/07/2018 1518   BILIRUBINUR NEGATIVE 11/07/2018 1518   KETONESUR NEGATIVE 11/07/2018 1518   PROTEINUR 100 (A) 11/07/2018 1518   UROBILINOGEN 1.0 08/19/2010 1827   NITRITE NEGATIVE 11/07/2018 1518   LEUKOCYTESUR TRACE (A) 11/07/2018 1518    Radiological Exams on Admission: Dg Chest 2 View  Result Date: 11/07/2018 CLINICAL DATA:  Shortness of breath and chest pain EXAM: CHEST - 2 VIEW COMPARISON:  October 14, 2016 FINDINGS: Degree of inspiration is shallow. There is bibasilar atelectasis. There is no edema or consolidation. There is cardiomegaly with pulmonary vascularity normal. No adenopathy. No bone lesions. IMPRESSION: Bibasilar atelectasis. No frank edema or consolidation. There is cardiomegaly. Electronically Signed   By:  Lowella Grip III M.D.   On: 11/07/2018 14:33   Ct Angio Chest Pe W/cm &/or Wo Cm  Result Date: 11/07/2018 CLINICAL DATA:  33 y/o M; shortness of breath, cough, tachycardia, chest pain. EXAM: CT ANGIOGRAPHY CHEST WITH CONTRAST TECHNIQUE: Multidetector CT imaging of the chest was performed using the standard protocol during bolus administration of intravenous contrast. Multiplanar CT image reconstructions and MIPs were obtained to evaluate the vascular anatomy. CONTRAST:  146m ISOVUE-370 IOPAMIDOL (ISOVUE-370) INJECTION 76% COMPARISON:  03/18/2016 CT chest. FINDINGS: Cardiovascular: Stable cardiomegaly. No pericardial effusion. Normal caliber thoracic aorta and main pulmonary artery. Satisfactory opacification of the pulmonary arteries mild respiratory motion artifact at the lung bases. Mediastinum/Nodes: No enlarged mediastinal, hilar, or axillary lymph nodes. Thyroid gland, trachea, and esophagus demonstrate no significant findings. Lungs/Pleura: 5 mm right upper lobe pulmonary nodule (series 8, image 102). Peribronchial thickening and minor atelectasis in  the lung bases. No consolidation, effusion, or pneumothorax. Upper Abdomen: No acute abnormality. Musculoskeletal: No chest wall abnormality. No acute or significant osseous findings. Review of the MIP images confirms the above findings. IMPRESSION: 1. Mild respiratory motion artifact at the lung bases. No pulmonary embolus identified. 2. Peribronchial thickening in the lung bases may reflect vascular congestion, bronchitis, or reactive airway disease. No consolidation. 3. 5 mm right upper lobe pulmonary nodule. No follow-up needed if patient is low-risk. Non-contrast chest CT can be considered in 12 months if patient is high-risk. This recommendation follows the consensus statement: Guidelines for Management of Incidental Pulmonary Nodules Detected on CT Images: From the Fleischner Society 2017; Radiology 2017; 284:228-243. 4. Stable cardiomegaly. Electronically Signed   By: LKristine GarbeM.D.   On: 11/07/2018 17:06    EKG: Independently reviewed.  Sinus tachycardia, no significant change since prior tracing.  Assessment/Plan Principal Problem:   Acute respiratory failure with hypoxia (HCC) Active Problems:   CHF exacerbation (HCC)   Acute bronchitis   Chest pain   Polycythemia   Pulmonary nodule   HTN (hypertension)   Acute hypoxic respiratory failure secondary to acute exacerbation of CHF and acute bronchitis Tachycardic on arrival.  Oxygen saturation 78% on room air per EMS.  Currently on 3 L oxygen via nasal cannula.  No wheezing noted by ED provider.  No wheezing appreciated on my exam.  Afebrile and no leukocytosis.  Lactic acid normal.  Influenza panel negative.  D-dimer elevated.  CTA negative for PE.  Does show peribronchial thickening in the lung bases which may reflect vascular congestion, bronchitis, or reactive airway disease.  No consolidation. BNP 162, above baseline.  Unable to find prior echo in the chart. -Received IV Lasix 40 mg once.  Continue IV Lasix 40 mg twice  daily starting tomorrow morning. -Continue metoprolol -Echocardiogram -Give Solu-Medrol 125 mg once.  Continue prednisone 40 mg daily starting tomorrow morning. -Duo nebs every 6 hours -Mucinex DM -Blood culture x2 ordered in the ED pending -Supplemental oxygen  Chest pain Risk factors for CAD include obesity, hypertension, and diabetes.  I-STAT troponin 0.11.  EKG not suggestive of ACS.   -Received aspirin 324 mg. -Continue to trend troponin -Echocardiogram  Mildly elevated LFTs AST and ALT borderline elevated.  Alk phos and T bili normal. -Right upper quadrant ultrasound  Chronic polycythemia Hemoglobin 19.6, previously elevated as well.  Patient does not smoke cigarettes. -Continue to monitor; outpatient follow-up  Pulmonary nodule CTA showing a 5 mm right upper lobe pulmonary nodule.  Patient does not have a history of cigarette smoking.  Hypertension -Continue metoprolol  DVT prophylaxis: Lovenox Code Status: Full Family Communication: Mother at bedside Disposition Plan: Anticipate discharge in 1 to 2 days. Consults called: None Admission status: Observation   Shela Leff MD [facility] Pager [phone]  If 7PM-7AM, please contact night-coverage [email or web address] Password [facility]  11/07/2018, 7:16 PM

## 2018-11-07 NOTE — ED Provider Notes (Signed)
CT scan negative for acute PE.  Symptoms may be related to infectious etiology however he does have elevated opponent.  We will consult with medical service for further evaluation.   Linwood DibblesKnapp, Heela Heishman, MD 11/07/18 [phone]

## 2018-11-07 NOTE — Progress Notes (Signed)
ANTICOAGULATION CONSULT NOTE - Initial Consult  Pharmacy Consult for heparin Indication: pulmonary embolus  No Known Allergies  Patient Measurements: Height: 5\' 8"  (172.7 cm) Weight: (!) 351 lb (159.2 kg) IBW/kg (Calculated) : 68.4 Heparin Dosing Weight: 107 kg  Vital Signs: BP: 145/95 (01/08 1600) Pulse Rate: 103 (01/08 1600)  Labs: Recent Labs    11/07/18 1246 11/07/18 1500  HGB 19.6*  --   HCT 66.1*  --   PLT 154  --   LABPROT  --  13.3  INR  --  1.02  CREATININE 1.28*  --     Estimated Creatinine Clearance: 121.6 mL/min (A) (by C-G formula based on SCr of 1.28 mg/dL (H)).  Assessment: CC/HPI: 33 yo m presenting with SOB and CP x 1 week - 78% on RA  PMH: COPD, hx of DVT, DM, HF  Anticoag: none pta - iv hep for r/o PE    Renal: SCr 1.28  Heme/Onc: H&H 19.6/66.1, Plt 154  Goal of Therapy:  Heparin level 0.3-0.7 units/ml Monitor platelets by anticoagulation protocol: Yes   Plan:  Heparin bolus 6000 units x 1 Heparin gtt 1900 units/hr Initial lvl 2200 Daily HL CBC F/U CTA/plans for oral AC if +  Isaac BlissMichael Surya Folden, PharmD, BCPS, BCCCP Clinical Pharmacist [phone]  Please check AMION for all [facility] numbers  11/07/2018 4:15 PM

## 2018-11-08 ENCOUNTER — Observation Stay (HOSPITAL_COMMUNITY): Payer: BLUE CROSS/BLUE SHIELD

## 2018-11-08 ENCOUNTER — Encounter (HOSPITAL_COMMUNITY): Payer: Self-pay | Admitting: General Practice

## 2018-11-08 DIAGNOSIS — Z6841 Body Mass Index (BMI) 40.0 and over, adult: Secondary | ICD-10-CM | POA: Diagnosis not present

## 2018-11-08 DIAGNOSIS — I878 Other specified disorders of veins: Secondary | ICD-10-CM | POA: Diagnosis present

## 2018-11-08 DIAGNOSIS — I10 Essential (primary) hypertension: Secondary | ICD-10-CM | POA: Diagnosis not present

## 2018-11-08 DIAGNOSIS — R0602 Shortness of breath: Secondary | ICD-10-CM | POA: Diagnosis present

## 2018-11-08 DIAGNOSIS — I2729 Other secondary pulmonary hypertension: Secondary | ICD-10-CM | POA: Diagnosis present

## 2018-11-08 DIAGNOSIS — D751 Secondary polycythemia: Secondary | ICD-10-CM | POA: Diagnosis present

## 2018-11-08 DIAGNOSIS — I2781 Cor pulmonale (chronic): Secondary | ICD-10-CM | POA: Diagnosis present

## 2018-11-08 DIAGNOSIS — J208 Acute bronchitis due to other specified organisms: Secondary | ICD-10-CM | POA: Diagnosis present

## 2018-11-08 DIAGNOSIS — J9621 Acute and chronic respiratory failure with hypoxia: Secondary | ICD-10-CM | POA: Diagnosis present

## 2018-11-08 DIAGNOSIS — I361 Nonrheumatic tricuspid (valve) insufficiency: Secondary | ICD-10-CM

## 2018-11-08 DIAGNOSIS — I370 Nonrheumatic pulmonary valve stenosis: Secondary | ICD-10-CM

## 2018-11-08 DIAGNOSIS — R911 Solitary pulmonary nodule: Secondary | ICD-10-CM | POA: Diagnosis present

## 2018-11-08 DIAGNOSIS — K828 Other specified diseases of gallbladder: Secondary | ICD-10-CM | POA: Diagnosis present

## 2018-11-08 DIAGNOSIS — J440 Chronic obstructive pulmonary disease with acute lower respiratory infection: Secondary | ICD-10-CM | POA: Diagnosis present

## 2018-11-08 DIAGNOSIS — E875 Hyperkalemia: Secondary | ICD-10-CM | POA: Diagnosis present

## 2018-11-08 DIAGNOSIS — J209 Acute bronchitis, unspecified: Secondary | ICD-10-CM

## 2018-11-08 DIAGNOSIS — I5033 Acute on chronic diastolic (congestive) heart failure: Secondary | ICD-10-CM | POA: Diagnosis present

## 2018-11-08 DIAGNOSIS — E662 Morbid (severe) obesity with alveolar hypoventilation: Secondary | ICD-10-CM | POA: Diagnosis present

## 2018-11-08 DIAGNOSIS — T502X5A Adverse effect of carbonic-anhydrase inhibitors, benzothiadiazides and other diuretics, initial encounter: Secondary | ICD-10-CM | POA: Diagnosis present

## 2018-11-08 DIAGNOSIS — N179 Acute kidney failure, unspecified: Secondary | ICD-10-CM | POA: Diagnosis present

## 2018-11-08 DIAGNOSIS — I890 Lymphedema, not elsewhere classified: Secondary | ICD-10-CM | POA: Diagnosis present

## 2018-11-08 DIAGNOSIS — K8020 Calculus of gallbladder without cholecystitis without obstruction: Secondary | ICD-10-CM | POA: Diagnosis present

## 2018-11-08 DIAGNOSIS — E119 Type 2 diabetes mellitus without complications: Secondary | ICD-10-CM | POA: Diagnosis not present

## 2018-11-08 DIAGNOSIS — Z9981 Dependence on supplemental oxygen: Secondary | ICD-10-CM | POA: Diagnosis not present

## 2018-11-08 DIAGNOSIS — J441 Chronic obstructive pulmonary disease with (acute) exacerbation: Secondary | ICD-10-CM | POA: Diagnosis present

## 2018-11-08 DIAGNOSIS — J9601 Acute respiratory failure with hypoxia: Secondary | ICD-10-CM | POA: Diagnosis not present

## 2018-11-08 DIAGNOSIS — I160 Hypertensive urgency: Secondary | ICD-10-CM | POA: Diagnosis present

## 2018-11-08 DIAGNOSIS — I82409 Acute embolism and thrombosis of unspecified deep veins of unspecified lower extremity: Secondary | ICD-10-CM | POA: Diagnosis present

## 2018-11-08 DIAGNOSIS — J9622 Acute and chronic respiratory failure with hypercapnia: Secondary | ICD-10-CM | POA: Diagnosis present

## 2018-11-08 DIAGNOSIS — I5032 Chronic diastolic (congestive) heart failure: Secondary | ICD-10-CM | POA: Diagnosis present

## 2018-11-08 DIAGNOSIS — I110 Hypertensive heart disease with heart failure: Secondary | ICD-10-CM | POA: Diagnosis present

## 2018-11-08 DIAGNOSIS — Z79899 Other long term (current) drug therapy: Secondary | ICD-10-CM | POA: Diagnosis not present

## 2018-11-08 HISTORY — DX: Acute on chronic diastolic (congestive) heart failure: I50.33

## 2018-11-08 LAB — ECHOCARDIOGRAM COMPLETE
Height: 68 in
Weight: 5616 oz

## 2018-11-08 LAB — TROPONIN I
Troponin I: 0.1 ng/mL (ref <0.03)
Troponin I: 0.09 ng/mL (ref <0.03)

## 2018-11-08 LAB — GLUCOSE, CAPILLARY
Glucose-Capillary: 175 mg/dL — ABNORMAL HIGH (ref 70–99)
Glucose-Capillary: 119 mg/dL — ABNORMAL HIGH (ref 70–99)
Glucose-Capillary: 178 mg/dL — ABNORMAL HIGH (ref 70–99)

## 2018-11-08 LAB — CBG MONITORING, ED: GLUCOSE-CAPILLARY: 194 mg/dL — AB (ref 70–99)

## 2018-11-08 LAB — HIV ANTIBODY (ROUTINE TESTING W REFLEX): HIV Screen 4th Generation wRfx: NONREACTIVE

## 2018-11-08 MED ORDER — PERFLUTREN LIPID MICROSPHERE
1.0000 mL | INTRAVENOUS | Status: AC | PRN
  Administered 2018-11-08: 2 mL via INTRAVENOUS
  Filled 2018-11-08: qty 10

## 2018-11-08 MED ORDER — IPRATROPIUM-ALBUTEROL 0.5-2.5 (3) MG/3ML IN SOLN
3.0000 mL | RESPIRATORY_TRACT | Status: DC
  Administered 2018-11-08: 3 mL via RESPIRATORY_TRACT
  Filled 2018-11-08: qty 3

## 2018-11-08 MED ORDER — METHYLPREDNISOLONE SODIUM SUCC 40 MG IJ SOLR
40.0000 mg | Freq: Two times a day (BID) | INTRAMUSCULAR | Status: AC
  Administered 2018-11-08 – 2018-11-10 (×4): 40 mg via INTRAVENOUS
  Filled 2018-11-08 (×4): qty 1

## 2018-11-08 MED ORDER — IPRATROPIUM-ALBUTEROL 0.5-2.5 (3) MG/3ML IN SOLN: 3.0000 mL | Freq: Four times a day (QID) | RESPIRATORY_TRACT | Status: DC

## 2018-11-08 MED ORDER — HYDRALAZINE HCL 20 MG/ML IJ SOLN: 10.0000 mg | INTRAMUSCULAR | Status: DC | PRN

## 2018-11-08 NOTE — Progress Notes (Addendum)
[facility] PROGRESS NOTE  Bayani Renteria RKY:[id number] DOB: 1986/09/03 DOA: 11/07/2018 PCP: Karleen Hampshire., MD  Assessment/Plan: Acute hypoxic respiratory failure secondary to acute exacerbation of chronic diastolic CHF in setting of acute bronchitis. Oxygen saturation 78% on room air per EMS. Improved somewhat this am.  Currently on 3 L oxygen via nasal cannula with oxygen saturation level 95%. He remains Afebrile and no leukocytosis.  Influenza panel negative.  D-dimer elevated and CTA negative for PE.  Does show peribronchial thickening in the lung bases which may reflect vascular congestion, bronchitis, or reactive airway disease.  No consolidation. BNP 162, above baseline. Echo with EF 60% and mild LVH. Of note he admits to seeing pulmonology in remote past and was prescribed oxygen but does not wear. He received IV Lasix 40 mg in ed. -Continue IV Lasix 40 mg twice daily starting tomorrow morning. Monitor electrolytes and kidney function -Continue metoprolol - continue Solu-Medrol 23m.   -Duo nebs every 4 hours -Mucinex DM -Blood culture pending. -Supplemental oxygen  Acute on chronic diastolic HF.  -see #1 -intake and output -daily weight -BB -lasix as noted -monitor kidney function  Acute kidney injury. Related to diuretics. Mild -continue IV lasix as noted above -careful monitoring of kidney funciton  Diabetes. Fair control -SSI -obtain HgA1c  Chest pain.no pain today. Risk factors for CAD include obesity, hypertension, and diabetes.  I-STAT troponin 0.11. troponin trending down.   EKG not suggestive of ACS.   -Received aspirin 324 mg. -Echocardiogram as noted above  Mildly elevated LFTs. Abdominal [place] layering shadowing gallstones within the non-distended gallbladder. No definite gallbladder wall thickening. No pericholecystic fluid. No sonographic Murphy sign.  Alk phos and T bili normal. -OP follow up  Chronic polycythemia Hemoglobin 19.6, previously  elevated as well.  Patient does not smoke cigarettes. -Continue to monitor; outpatient follow-up  Pulmonary nodule CTA showing a 5 mm right upper lobe pulmonary nodule.  Patient does not have a history of cigarette smoking.  Hypertension poor control. Home med include lasix and metoprolol.  -home metoprolol -lasix as noted above -prn hydralazine   Code Status: full code Family Communication: none present Disposition Plan: home in am hopefully   Consultants:    Procedures:  echo  Antibiotics:    HPI/Subjective: Admitted with acute respiratory failure secondary to acute on chronic diastolic heart failure in setting of acute bronchitis  Reports breathing better.   Objective: Vitals:   11/08/18 0930 11/08/18 1215  BP: 100/85 (!) 141/111  Pulse: 92 86  Resp: 18 18  Temp:  98.1 F (36.7 C)  SpO2: 95% 95%   No intake or output data in the 24 hours ending 11/08/18 1458 Filed Weights   11/07/18 1158 11/08/18 1215  Weight: (!) 159.2 kg (!) 153.5 kg    Exam:   General:  Morbidly obese alert watching TV  Cardiovascular: HS distant rrr no mgr trace-1+ LE edema bilaterally, chronic venous stasis changes  Respiratory: mild increased work of breathing with conversation. BS with fair air movement, scattered rhonchi, faint wheeze no crackles  Abdomen: obese soft +BS non-tender to palpation   Musculoskeletal: joints without swelling/erythema   Data Reviewed: Basic Metabolic Panel: Recent Labs  Lab 11/07/18 1246  NA 142  K 4.5  CL 98  CO2 35*  GLUCOSE 94  BUN 16  CREATININE 1.28*  CALCIUM 8.9   Liver Function Tests: Recent Labs  Lab 11/07/18 1246  AST 48*  ALT 54*  ALKPHOS 62  BILITOT 0.8  PROT 7.1  ALBUMIN 2.9*   Recent Labs  Lab 11/07/18 1246  LIPASE 29   No results for input(s): AMMONIA in the last 168 hours. CBC: Recent Labs  Lab 11/07/18 1246  WBC 10.0  NEUTROABS 7.8*  HGB 19.6*  HCT 66.1*  MCV 100.9*  PLT 154   Cardiac  Enzymes: Recent Labs  Lab 11/07/18 1914 11/08/18 0314 11/08/18 0940  TROPONINI 0.12* 0.10* 0.09*   BNP (last 3 results) Recent Labs    11/07/18 1246  BNP 162.6*    ProBNP (last 3 results) No results for input(s): PROBNP in the last 8760 hours.  CBG: Recent Labs  Lab 11/08/18 0826 11/08/18 1217  GLUCAP 194* 175*    Recent Results (from the past 240 hour(s))  Culture, blood (routine x 2)     Status: None (Preliminary result)   Collection Time: 11/07/18  2:15 PM  Result Value Ref Range Status   Specimen Description BLOOD SITE NOT SPECIFIED  Final   Special Requests   Final    BOTTLES DRAWN AEROBIC AND ANAEROBIC Blood Culture results may not be optimal due to an excessive volume of blood received in culture bottles   Culture   Final    NO GROWTH 1 DAY Performed at [facility], [place] [address], [place], [place] [address]    Report Status PENDING  Incomplete     Studies: Dg Chest 2 View  Result Date: 11/07/2018 CLINICAL DATA:  Shortness of breath and chest pain EXAM: CHEST - 2 VIEW COMPARISON:  October 14, 2016 FINDINGS: Degree of inspiration is shallow. There is bibasilar atelectasis. There is no edema or consolidation. There is cardiomegaly with pulmonary vascularity normal. No adenopathy. No bone lesions. IMPRESSION: Bibasilar atelectasis. No frank edema or consolidation. There is cardiomegaly. Electronically Signed   By: Lowella Grip III M.D.   On: 11/07/2018 14:33   Ct Angio Chest Pe W/cm &/or Wo Cm  Result Date: 11/07/2018 CLINICAL DATA:  33 y/o M; shortness of breath, cough, tachycardia, chest pain. EXAM: CT ANGIOGRAPHY CHEST WITH CONTRAST TECHNIQUE: Multidetector CT imaging of the chest was performed using the standard protocol during bolus administration of intravenous contrast. Multiplanar CT image reconstructions and MIPs were obtained to evaluate the vascular anatomy. CONTRAST:  176m ISOVUE-370 IOPAMIDOL (ISOVUE-370) INJECTION 76% COMPARISON:   03/18/2016 CT chest. FINDINGS: Cardiovascular: Stable cardiomegaly. No pericardial effusion. Normal caliber thoracic aorta and main pulmonary artery. Satisfactory opacification of the pulmonary arteries mild respiratory motion artifact at the lung bases. Mediastinum/Nodes: No enlarged mediastinal, hilar, or axillary lymph nodes. Thyroid gland, trachea, and esophagus demonstrate no significant findings. Lungs/Pleura: 5 mm right upper lobe pulmonary nodule (series 8, image 102). Peribronchial thickening and minor atelectasis in the lung bases. No consolidation, effusion, or pneumothorax. Upper Abdomen: No acute abnormality. Musculoskeletal: No chest wall abnormality. No acute or significant osseous findings. Review of the MIP images confirms the above findings. IMPRESSION: 1. Mild respiratory motion artifact at the lung bases. No pulmonary embolus identified. 2. Peribronchial thickening in the lung bases may reflect vascular congestion, bronchitis, or reactive airway disease. No consolidation. 3. 5 mm right upper lobe pulmonary nodule. No follow-up needed if patient is low-risk. Non-contrast chest CT can be considered in 12 months if patient is high-risk. This recommendation follows the consensus statement: Guidelines for Management of Incidental Pulmonary Nodules Detected on CT Images: From the Fleischner Society 2017; Radiology 2017; 284:228-243. 4. Stable cardiomegaly. Electronically Signed   By: LKristine GarbeM.D.   On: 11/07/2018 17:06   [place]  Abdomen Limited Ruq  Result Date: 11/07/2018 CLINICAL DATA:  Elevated liver function tests. Chest pain. Dyspnea. EXAM: ULTRASOUND ABDOMEN LIMITED RIGHT UPPER QUADRANT COMPARISON:  None. FINDINGS: Gallbladder: Multiple layering shadowing gallstones within the nondistended gallbladder. No definite gallbladder wall thickening. No pericholecystic fluid. No sonographic Murphy sign. Common bile duct: Diameter: 5 mm Liver: No focal lesion identified. Within normal  limits in parenchymal echogenicity. Portal vein is patent on color Doppler imaging with normal direction of blood flow towards the liver. Small right pleural effusion noted. IMPRESSION: 1. Cholelithiasis.  No evidence of acute cholecystitis. 2. No biliary ductal dilatation. 3. Normal liver. 4. Small right pleural effusion incidentally noted. Electronically Signed   By: Ilona Sorrel M.D.   On: 11/07/2018 20:30    Scheduled Meds: . dextromethorphan-guaiFENesin  2 tablet Oral BID  . enoxaparin (LOVENOX) injection  40 mg Subcutaneous Daily  . furosemide  40 mg Intravenous BID  . insulin aspart  0-9 Units Subcutaneous TID WC  . ipratropium-albuterol  3 mL Nebulization Q4H  . methylPREDNISolone (SOLU-MEDROL) injection  40 mg Intravenous Q12H  . metoprolol tartrate  100 mg Oral BID   Continuous Infusions:  Principal Problem:   Acute respiratory failure with hypoxia (HCC) Active Problems:   Acute bronchitis   Acute on chronic diastolic heart failure (HCC)   Polycythemia   Pulmonary nodule   HTN (hypertension)   Diabetes mellitus without complication ([place])   DVT (deep venous thrombosis) ([place])    Time spent: 49 minutes    [place] NP [facility] If 7PM-7AM, please contact night-coverage at [email or web address], password [facility] 11/08/2018, 2:58 PM  LOS: 0 days

## 2018-11-08 NOTE — Progress Notes (Signed)
  Echocardiogram 2D Echocardiogram with definity has been performed.  Leta Jungling M 11/08/2018, 10:47 AM

## 2018-11-08 NOTE — ED Notes (Signed)
Ordered bfast tray 

## 2018-11-08 NOTE — ED Notes (Signed)
Echocardiogram being performed bedside

## 2018-11-08 NOTE — Progress Notes (Signed)
Visit made to patients room to set up CPAP.  Patient has severe OSA and needs CPAP qhs.  He is non compliant at home.  He has not been able to maintain his SP02 while sleeping here in the ED.  RN was concerned and wanted to try CPAP.  Patient was not interested I discussed importance and advised him to try it.  He agreed with coaching of his DAD who is at beside as well.  Will continue to monitor.

## 2018-11-08 NOTE — ED Notes (Signed)
Pt c/o abd pain and tiotal body cramps

## 2018-11-08 NOTE — ED Notes (Signed)
Pt moved back into a room  rm 29

## 2018-11-08 NOTE — ED Notes (Signed)
Mustard given for his body cramps as requested by the pt   He has sleep apnea and is on c-pap at night which he does not like to use

## 2018-11-09 ENCOUNTER — Inpatient Hospital Stay (HOSPITAL_COMMUNITY): Payer: BLUE CROSS/BLUE SHIELD

## 2018-11-09 DIAGNOSIS — J9622 Acute and chronic respiratory failure with hypercapnia: Secondary | ICD-10-CM

## 2018-11-09 DIAGNOSIS — I5033 Acute on chronic diastolic (congestive) heart failure: Secondary | ICD-10-CM

## 2018-11-09 DIAGNOSIS — J9601 Acute respiratory failure with hypoxia: Secondary | ICD-10-CM

## 2018-11-09 DIAGNOSIS — D751 Secondary polycythemia: Secondary | ICD-10-CM

## 2018-11-09 DIAGNOSIS — E662 Morbid (severe) obesity with alveolar hypoventilation: Secondary | ICD-10-CM

## 2018-11-09 LAB — GLUCOSE, CAPILLARY
GLUCOSE-CAPILLARY: 136 mg/dL — AB (ref 70–99)
GLUCOSE-CAPILLARY: 167 mg/dL — AB (ref 70–99)
Glucose-Capillary: 196 mg/dL — ABNORMAL HIGH (ref 70–99)
Glucose-Capillary: 162 mg/dL — ABNORMAL HIGH (ref 70–99)

## 2018-11-09 LAB — RESPIRATORY PANEL BY PCR
ADENOVIRUS-RVPPCR: NOT DETECTED
Bordetella pertussis: NOT DETECTED
CORONAVIRUS OC43-RVPPCR: NOT DETECTED
Chlamydophila pneumoniae: NOT DETECTED
Coronavirus 229E: NOT DETECTED
Coronavirus HKU1: NOT DETECTED
Coronavirus NL63: NOT DETECTED
Influenza A: NOT DETECTED
Influenza B: NOT DETECTED
MYCOPLASMA PNEUMONIAE-RVPPCR: NOT DETECTED
Metapneumovirus: NOT DETECTED
PARAINFLUENZA VIRUS 1-RVPPCR: NOT DETECTED
Parainfluenza Virus 2: NOT DETECTED
Parainfluenza Virus 3: NOT DETECTED
Parainfluenza Virus 4: NOT DETECTED
Respiratory Syncytial Virus: NOT DETECTED
Rhinovirus / Enterovirus: NOT DETECTED

## 2018-11-09 LAB — CBC WITH DIFFERENTIAL/PLATELET
ABS IMMATURE GRANULOCYTES: 0.03 10*3/uL (ref 0.00–0.07)
BASOS PCT: 0 %
Basophils Absolute: 0 10*3/uL (ref 0.0–0.1)
Eosinophils Absolute: 0 10*3/uL (ref 0.0–0.5)
Eosinophils Relative: 0 %
HCT: 64.4 % — ABNORMAL HIGH (ref 39.0–52.0)
Hemoglobin: 18.7 g/dL — ABNORMAL HIGH (ref 13.0–17.0)
Immature Granulocytes: 0 %
Lymphocytes Relative: 7 %
Lymphs Abs: 0.8 10*3/uL (ref 0.7–4.0)
MCH: 29.7 pg (ref 26.0–34.0)
MCHC: 29 g/dL — ABNORMAL LOW (ref 30.0–36.0)
MCV: 102.2 fL — ABNORMAL HIGH (ref 80.0–100.0)
Monocytes Absolute: 0.6 10*3/uL (ref 0.1–1.0)
Monocytes Relative: 6 %
Neutro Abs: 9.4 10*3/uL — ABNORMAL HIGH (ref 1.7–7.7)
Neutrophils Relative %: 87 %
Platelets: 158 10*3/uL (ref 150–400)
RBC: 6.3 MIL/uL — ABNORMAL HIGH (ref 4.22–5.81)
RDW: 16.7 % — ABNORMAL HIGH (ref 11.5–15.5)
WBC: 10.9 10*3/uL — ABNORMAL HIGH (ref 4.0–10.5)
nRBC: 0.6 % — ABNORMAL HIGH (ref 0.0–0.2)

## 2018-11-09 LAB — BLOOD GAS, ARTERIAL
ACID-BASE EXCESS: 15.8 mmol/L — AB (ref 0.0–2.0)
Bicarbonate: 41.7 mmol/L — ABNORMAL HIGH (ref 20.0–28.0)
DRAWN BY: 275531
O2 Content: 3 L/min
O2 Saturation: 93.8 %
PH ART: 7.385 (ref 7.350–7.450)
Patient temperature: 98.6
pCO2 arterial: 71.2 mmHg (ref 32.0–48.0)
pO2, Arterial: 75.3 mmHg — ABNORMAL LOW (ref 83.0–108.0)

## 2018-11-09 LAB — COMPREHENSIVE METABOLIC PANEL
ALT: 35 U/L (ref 0–44)
AST: 28 U/L (ref 15–41)
Albumin: 2.6 g/dL — ABNORMAL LOW (ref 3.5–5.0)
Alkaline Phosphatase: 48 U/L (ref 38–126)
Anion gap: 5 (ref 5–15)
BUN: 18 mg/dL (ref 6–20)
CO2: 43 mmol/L — ABNORMAL HIGH (ref 22–32)
CREATININE: 1.15 mg/dL (ref 0.61–1.24)
Calcium: 9.1 mg/dL (ref 8.9–10.3)
Chloride: 92 mmol/L — ABNORMAL LOW (ref 98–111)
Glucose, Bld: 123 mg/dL — ABNORMAL HIGH (ref 70–99)
Potassium: 5.3 mmol/L — ABNORMAL HIGH (ref 3.5–5.1)
Sodium: 140 mmol/L (ref 135–145)
Total Bilirubin: 1.1 mg/dL (ref 0.3–1.2)
Total Protein: 6.5 g/dL (ref 6.5–8.1)

## 2018-11-09 LAB — HEMOGLOBIN A1C
Hgb A1c MFr Bld: 7 % — ABNORMAL HIGH (ref 4.8–5.6)
MEAN PLASMA GLUCOSE: 154.2 mg/dL

## 2018-11-09 MED ORDER — IPRATROPIUM-ALBUTEROL 0.5-2.5 (3) MG/3ML IN SOLN: 3.0000 mL | RESPIRATORY_TRACT | Status: DC | PRN

## 2018-11-09 MED ORDER — FUROSEMIDE 40 MG PO TABS: 40.0000 mg | ORAL_TABLET | Freq: Every day | ORAL | Status: DC

## 2018-11-09 MED ORDER — FUROSEMIDE 10 MG/ML IJ SOLN
40.0000 mg | Freq: Two times a day (BID) | INTRAMUSCULAR | Status: DC
  Administered 2018-11-09 – 2018-11-10 (×2): 40 mg via INTRAVENOUS
  Filled 2018-11-09 (×2): qty 4

## 2018-11-09 NOTE — Progress Notes (Signed)
Pt has been 89-91% on 3L oxygen this am. Pt occasionally drops to 89% while on CPAP as well. When pt has been on RA this am he drops as low as 78%, placed on 3L and returns to 94% after couple of min.  Pt currently on CPAP while napping. Pt does not have home CPAP. Night RN reported pt desated to 70's-80's during sleep last night with CPAP on. Cont to monitor. Emelda Brothers RN

## 2018-11-09 NOTE — Consult Note (Addendum)
NAME:  Edward Castro, MRN:  [id number], DOB:  May 31, 1986, LOS: 1 ADMISSION DATE:  11/07/2018, CONSULTATION DATE:  11/09/18 REFERRING MD:  Dr. Butler Denmarkizwan, CHIEF COMPLAINT:  Dyspnea, hypoxia    Brief History   33 y/o morbidly obese M, non-smoker, admitted with c/o abd pain, body cramps & SOB in the setting of hypertensive urgency.    History of present illness   33 y/o M [facility] with complaints of abdominal pain, body cramps, chest pain,  & shortness of breath.  He reported on admit he had a cold approximately one week before presentation.  Symptoms began with nasal congestion and sore throat. He was exposed to a sick child, but he does not think he had the flu. He later developed a cough, palpitations, central chest pain & shortness of breath with exertion.  He carries a prior history of DVT but had not been on blood thinners in the last 5 years.  Additionally, he reported chronic LE swelling and drainage / crusting of his eyes.  CTA of the chest was evaluated and negative for PE.  ER evaluation found him to be hypertensive with pressures to 170/122.  He was treated with NTG gtt. Patient [facility] for evaluation of dyspnea and management of hypertensive urgency.  The patient was diuresed and is currently down 7kg's (per weight review, no I/O's recorded).   [facility] has [facility]. Initial consult resulted in patient being issued oxygen therapy,especially for exertion,  which he has not been compliant wearing. Despite diuresis this admission, he remain oxygen dependent.( RA sat= 84%, RA Ambulation= 79%). Recommendation for sleep study and CPAP were never completed, as patient was difficult to get in touch with and schedule [facility]. Patient currently reports he feels better, but continues to need oxygen. Per nursing he desaturated to 89% on CPAP last night with 3 L oxygen bled into the line.  Weight is down 7 kg. He remains on 3 L [place] at rest, and required about  6 L with exertion. PCCM have been asked to consult for acute on chronic respiratory failure.   Past Medical History  CHF - LVEF 55-60%, difficult ECHO due to habitus  COPD - O2 dependent at baseline [facility], 2L QHS Pulm HTN  OSA  Lymphedema  DM   HTN  Polycythemia   Significant Hospital Events   1/08  Admit  1/10  PCCM consulted for hypoxia   Consults:  PCCM   Procedures:    Significant Diagnostic Tests:  CTA Chest 1/8 >> neg for PE, peribronchial thickening, no consolidation, 5mm RUL pulmonary nodule RUQ US 1/8 >> cholelithiasis, no acute cholecystitis, no biliary ductal dilation, small R effusion CXR 11/09/2018>>Stable generalized cardiomegaly.  No edema or consolidation.   Micro Data:  BCx2 1/8 >>  RVP 1/8 >>   Antimicrobials:     Interim history/subjective:  Feeling better, continued to require oxygen 3L at rest despite good diuresis. Wore CPAP through the night.  Objective   Blood pressure 130/78, pulse 75, temperature 98.2 F (36.8 C), temperature source Oral, resp. rate 18, height 5\' 8"  (1.727 m), weight (!) 152 kg, SpO2 95 %.        Intake/Output Summary (Last 24 hours) at 11/09/2018 1347 Last data filed at 11/08/2018 2135 Gross per 24 hour  Intake 480 ml  Output 350 ml  Net 130 ml   Filed Weights   11/07/18 1158 11/08/18 1215 11/09/18 0652  Weight: Marland Kitchen(!)  159.2 kg (!) 153.5 kg (!) 152 kg    Examination: General: Alert, obese male supine in bed on 3 L oxygen, In NAD HEENT: NCAT, Thick neck, No LAD Neuro: CN intact, awake , alert and oriented. Appropriate CV: s1s2 rrr, no m/r/g PULM: Bilateral chest excursion, even/non-labored, Coarse throughout lungs bilaterally , diminished per bases GL:OVFI, non-tender, bsx4 active  Extremities: warm/dry, Obese, changes of venous stasis/ cellulitis bilaterally, trace edema  Skin: no rashes or lesions  Resolved Hospital Problem list      Assessment & Plan:   Acute on Chronic Hypoxemic Respiratory  Failure  Pulmonary HTN  Non-compliant with home oxygen ( Wants to be able to continue to work)  P: Wean O2 for sats for 88-92% Encourage compliance of oxygen therapy Assess for ambulatory O2 needs prior to discharge  Assess RVP ( determine trigger/ etiology) ECHO images poorly visualized, ? Suspect Cor pulmonale  Continued diuresis Lasix 40 mg BID x 3 doses >> optimize status prior to discharge Close Pulmonary follow up ( 11/16/2018)   OSA/ OHS - 2L O2 dependent QHS>> Non-compliant - Suspect  chronic retainer, CO2 on chemistry 43, 33 in 2017 - Followed by [facility] Pulmonology, but did not get sleep study recommended  - Epworth Score 16 today - Awakens with HA - Awakens feeling tired Body mass index is 50.95 kg/m. P: - ABG now - Trelegy if qualifies for discharge - Follow up Pulmonary as OP with >> Alpha testing/ PFT's/ Split night Sleep Study/ CPAP if indicated - Appointment 11/16/2018 at 2 pm, arrive at 1:45 Elisha Headland, NP) - Spent 15  minutes face to face discussion reviewing risks of untreated OSA with patient and his fiance  Super Obesity/ ? OHS BMI of 50.95 Plan: Consider medical weight loss specialist consult as OP Counseling about weight loss and benefit in OSA/OHS  5 mm RUL Pulmonary Nodule Never smoker  Plan: Consider follow up imaging as OP  Hypertensive Urgency  P: BP control per primary   Heart Failure Cardiomegally Not followed by cards States he is compliant with daily lasix P: Continue Lasix as above>> optimize prior to discharge  Consider referral to Cards/ HF clinic  Best practice:  Diet: heart healthy  Pain/Anxiety/Delirium protocol (if indicated): n/a  VAP protocol (if indicated): n/a  DVT prophylaxis: enoxaparin  GI prophylaxis: n/a  Glucose control: SSI  Mobility: As tolerated  Code Status: Full Code  Family Communication: Both patient and fiance updated by Dr. Kendrick Fries and myself 11/09/2018 Disposition: Per [facility]  Labs   CBC: Recent  Labs  Lab 11/07/18 1246 11/09/18 0434  WBC 10.0 10.9*  NEUTROABS 7.8* 9.4*  HGB 19.6* 18.7*  HCT 66.1* 64.4*  MCV 100.9* 102.2*  PLT 154 158    Basic Metabolic Panel: Recent Labs  Lab 11/07/18 1246 11/09/18 0434  NA 142 140  K 4.5 5.3*  CL 98 92*  CO2 35* 43*  GLUCOSE 94 123*  BUN 16 18  CREATININE 1.28* 1.15  CALCIUM 8.9 9.1   GFR: Estimated Creatinine Clearance: 131.6 mL/min (by C-G formula based on SCr of 1.15 mg/dL). Recent Labs  Lab 11/07/18 1246 11/07/18 1303 11/09/18 0434  WBC 10.0  --  10.9*  LATICACIDVEN  --  1.45  --     Liver Function Tests: Recent Labs  Lab 11/07/18 1246 11/09/18 0434  AST 48* 28  ALT 54* 35  ALKPHOS 62 48  BILITOT 0.8 1.1  PROT 7.1 6.5  ALBUMIN 2.9* 2.6*   Recent Labs  Lab 11/07/18 1246  LIPASE 29   No results for input(s): AMMONIA in the last 168 hours.  ABG No results found for: PHART, PCO2ART, PO2ART, HCO3, TCO2, ACIDBASEDEF, O2SAT   Coagulation Profile: Recent Labs  Lab 11/07/18 1500  INR 1.02    Cardiac Enzymes: Recent Labs  Lab 11/07/18 1914 11/08/18 0314 11/08/18 0940  TROPONINI 0.12* 0.10* 0.09*    HbA1C: Hgb A1c MFr Bld  Date/Time Value Ref Range Status  11/09/2018 04:34 AM 7.0 (H) 4.8 - 5.6 % Final    Comment:    (NOTE) Pre diabetes:          5.7%-6.4% Diabetes:              >6.4% Glycemic control for   <7.0% adults with diabetes     CBG: Recent Labs  Lab 11/08/18 1217 11/08/18 1537 11/08/18 2107 11/09/18 0736 11/09/18 1135  GLUCAP 175* 119* 178* 136* 167*    Review of Systems: Positives in [place]   Gen: Denies fever, chills, weight change,+  fatigue, No night sweats HEENT: Denies blurred vision, double vision, hearing loss, tinnitus, sinus congestion, rhinorrhea, sore throat, neck stiffness, dysphagia PULM: + shortness of breath, No cough, sputum production, hemoptysis, rare wheezing CV: + chest pain, + edema, + orthopnea, + paroxysmal nocturnal dyspnea, No palpitations GI:  Denies abdominal pain, nausea, vomiting, diarrhea, hematochezia, melena, constipation, change in bowel habits GU: Denies dysuria, hematuria, polyuria, oliguria, urethral discharge Endocrine: Denies hot or cold intolerance, polyuria, polyphagia or appetite change Derm: Denies rash, dry skin, scaling or peeling skin change Heme: Denies easy bruising, bleeding, bleeding gums Neuro: +  Headache upon awakening, No numbness, weakness, slurred speech, loss of memory or consciousness  Past Medical History  He,  has a past medical history of Acute respiratory failure with hypoxia (HCC), CHF (congestive heart failure) (HCC), COPD (chronic obstructive pulmonary disease) (HCC), Diabetes mellitus without complication (HCC), DVT (deep venous thrombosis) (HCC), Hypertension, and Pulmonary hypertension (HCC).   Surgical History    Past Surgical History:  Procedure Laterality Date  . CATARACT EXTRACTION    . EYE SURGERY    . RETINAL DETACHMENT SURGERY       Social History   reports that he has never smoked. He has never used smokeless tobacco. He reports that he does not drink alcohol or use drugs.   Family History   His family history is not on file.   Allergies No Known Allergies   Home Medications  Prior to Admission medications   Medication Sig Start Date End Date Taking? Authorizing Provider  furosemide (LASIX) 40 MG tablet Take 40 mg by mouth daily.    Yes [provider]  metFORMIN (GLUCOPHAGE) 500 MG tablet Take 500 mg by mouth daily. 04/24/18  Yes [provider]  metoprolol tartrate (LOPRESSOR) 100 MG tablet Take 100 mg by mouth 2 (two) times daily. 11/01/16  Yes [provider]  doxycycline (VIBRAMYCIN) 100 MG capsule Take 1 capsule (100 mg total) by mouth 2 (two) times daily. Patient not taking: Reported on 11/07/2018 10/14/16   Garlon HatchetSanders, Lisa M, PA-C  naproxen (NAPROSYN) 500 MG tablet Take 1 tablet (500 mg total) by mouth 2 (two) times daily. Patient not taking:  Reported on 11/07/2018 03/19/16   Vanetta MuldersZackowski, Scott, MD  orphenadrine (NORFLEX) 100 MG tablet Take 1 tablet (100 mg total) by mouth 2 (two) times daily. Patient not taking: Reported on 11/07/2018 03/30/15   Arby BarrettePfeiffer, Marcy, MD  predniSONE (DELTASONE) 20 MG tablet 3 tabs po day  one, then 2 po daily x 4 days Patient not taking: Reported on 11/07/2018 03/30/15   Arby Barrette, MD  traMADol (ULTRAM) 50 MG tablet Take 1 tablet (50 mg total) by mouth every 6 (six) hours as needed. Patient not taking: Reported on 11/07/2018 03/30/15   Arby Barrette, MD     Critical care time:     Bevelyn Ngo, AGACNP-BC [facility] Pgr: [phone] or if no answer [phone] 11/09/2018, 1:47 PM

## 2018-11-09 NOTE — Progress Notes (Signed)
RT offered to place pt on CPAP for second time tonight and pt stated he may want it later tonight. RT will continue to monitor.

## 2018-11-09 NOTE — Progress Notes (Signed)
SATURATION QUALIFICATIONS: (This note is used to comply with regulatory documentation for home oxygen)  Patient Saturations on Room Air at Rest = 84%   Patient Saturations on Room Air while Ambulating = 79%  Patient Saturations on 6 Liters of oxygen while Ambulating = 92%, turned back down to 3L while sitting, sat's 90%  Please briefly explain why patient needs home oxygen:

## 2018-11-09 NOTE — Progress Notes (Signed)
RT placed pt on CPAP dreamstation on 6cmH2O with 3lpm bled into the system. Pt resting comfortably. RT will continue to monitor.

## 2018-11-09 NOTE — Progress Notes (Addendum)
PROGRESS NOTE    Edward Castro   FUX:[id number]  DOB: 07-21-1986  DOA: 11/07/2018 PCP: Laqueta Due., MD   Brief Narrative:  Edward Castro is a 33 y.o. male with medical history significant of CHF,  type 2 diabetes, essential hypertension, pulmonary hypertension presenting to the hospital via EMS for evaluation of shortness of breath and chest pain.  Per EMS report, oxygen saturation 78% on room air.  He was placed on 4 L oxygen via nasal cannula. Sent from his PCP office at [facility] for hypoxia. CTA negative for PE but shows peribronchial thickening in the lung bases may reflect vascular congestion, bronchitis, or reactive airway disease. No consolidation.  Treated for CHF in hospital with Lasix and COPD exacerbation with IV steroids.   PCP note from 04/24/18 in care everywhere reviewed. Per note, he has chronic O2 dependence and has been non compliant with using is O2, following up to have a sleep study, taking his medications has never followed up with pulmonary. Per her notes, he feels he does not need O2.    Subjective: Cough is improving. He has not ambulated much at thus unable to tell if he is short of breath still. No short of breath at rest.     Assessment & Plan:   Principal Problem:   Acute respiratory failure with hypoxia - possibly acute bronchitis - like has chronic respiratory failure (per PCP notes) related to his obesity- the patient told his PCP that he did not need O2 and although he has a tank at home, he does not use it- advised him that he needs to wear his O2 at all times - needing 6 L O2 on ambulation today- I do not feel he is fluid overloaded any more - Cont Nebs - I have asked PCCM for an assessment.   Active Problems:   HTN (hypertension)   Acute on chronic diastolic heart failure - diuresed with IV Lasix- weight dropped from 159.2 to 152 kg - ECHO below is not a good study but EF is normal (may have diastolic CHF) - transition to home dose of Lasix and  follow   Probable OSA - patient has never followed up for a sleep study- likely will need BiPAP at bedtime rather than a CPAP- f/u on PCCM eval    Diabetes mellitus without complication  - HbA1c 7.0- Metformin at home- cont ISS  Chest pain - likely related to current cough  - mild elevation of troponin noted but flat trend- no need for further cardiac work up at this time  Hyperkalemia - follow   Polycythemia  - likely due to not consistently wearing his O2    Pulmonary nodule - needs outpt follow up  Lymphedema - elevate legs- place TEDS   Time spent in minutes:  40 min- extensive review of outpt and inpatient chart done- conversation with patient had DVT prophylaxis: Lovenox Code Status: Full code Family Communication:  Disposition Plan: home- f/u on PCCM assessment Consultants:   PCCM Procedures:   2 d ECHO Study Conclusions  - Left ventricle: The cavity size was normal. Wall thickness was   increased in a pattern of mild LVH. Systolic function was normal.   The estimated ejection fraction was in the range of 55% to 60%.   The study is not technically sufficient to allow evaluation of LV   diastolic function. - Left atrium: The atrium was mildly dilated. - Right ventricle: Poorly visualized appears dilated - Impressions: No real apical images poor  study.  Impressions:  - No real apical images poor study. Antimicrobials:  Anti-infectives (From admission, onward)   None       Objective: Vitals:   11/09/18 0904 11/09/18 1034 11/09/18 1035 11/09/18 1100  BP:      Pulse:      Resp:      Temp:      TempSrc:      SpO2: (!) 87% (!) 87% (!) 88% 95%  Weight:      Height:        Intake/Output Summary (Last 24 hours) at 11/09/2018 1337 Last data filed at 11/08/2018 2135 Gross per 24 hour  Intake 480 ml  Output 350 ml  Net 130 ml   Filed Weights   11/07/18 1158 11/08/18 1215 11/09/18 0652  Weight: (!) 159.2 kg (!) 153.5 kg (!) 152 kg     Examination: General exam: Appears comfortable  HEENT: PERRLA, oral mucosa moist, no sclera icterus or thrush Respiratory system: rhonchi, Respiratory effort normal- 88% on room air (checked by myself) Cardiovascular system: S1 & S2 heard, RRR.   Gastrointestinal system: Abdomen soft, non-tender, nondistended. Normal bowel sounds. Central nervous system: Alert and oriented. No focal neurological deficits. Extremities: No cyanosis, clubbing - + pedal edema Skin: No rashes or ulcers Psychiatry:  Mood & affect appropriate.     Data Reviewed: I have personally reviewed following labs and imaging studies  CBC: Recent Labs  Lab 11/07/18 1246 11/09/18 0434  WBC 10.0 10.9*  NEUTROABS 7.8* 9.4*  HGB 19.6* 18.7*  HCT 66.1* 64.4*  MCV 100.9* 102.2*  PLT 154 158   Basic Metabolic Panel: Recent Labs  Lab 11/07/18 1246 11/09/18 0434  NA 142 140  K 4.5 5.3*  CL 98 92*  CO2 35* 43*  GLUCOSE 94 123*  BUN 16 18  CREATININE 1.28* 1.15  CALCIUM 8.9 9.1   GFR: Estimated Creatinine Clearance: 131.6 mL/min (by C-G formula based on SCr of 1.15 mg/dL). Liver Function Tests: Recent Labs  Lab 11/07/18 1246 11/09/18 0434  AST 48* 28  ALT 54* 35  ALKPHOS 62 48  BILITOT 0.8 1.1  PROT 7.1 6.5  ALBUMIN 2.9* 2.6*   Recent Labs  Lab 11/07/18 1246  LIPASE 29   No results for input(s): AMMONIA in the last 168 hours. Coagulation Profile: Recent Labs  Lab 11/07/18 1500  INR 1.02   Cardiac Enzymes: Recent Labs  Lab 11/07/18 1914 11/08/18 0314 11/08/18 0940  TROPONINI 0.12* 0.10* 0.09*   BNP (last 3 results) No results for input(s): PROBNP in the last 8760 hours. HbA1C: Recent Labs    11/09/18 0434  HGBA1C 7.0*   CBG: Recent Labs  Lab 11/08/18 1217 11/08/18 1537 11/08/18 2107 11/09/18 0736 11/09/18 1135  GLUCAP 175* 119* 178* 136* 167*   Lipid Profile: No results for input(s): CHOL, HDL, LDLCALC, TRIG, CHOLHDL, LDLDIRECT in the last 72 hours. Thyroid  Function Tests: No results for input(s): TSH, T4TOTAL, FREET4, T3FREE, THYROIDAB in the last 72 hours. Anemia Panel: No results for input(s): VITAMINB12, FOLATE, FERRITIN, TIBC, IRON, RETICCTPCT in the last 72 hours. Urine analysis:    Component Value Date/Time   COLORURINE YELLOW 11/07/2018 1518   APPEARANCEUR CLEAR 11/07/2018 1518   LABSPEC 1.018 11/07/2018 1518   PHURINE 6.0 11/07/2018 1518   GLUCOSEU NEGATIVE 11/07/2018 1518   HGBUR NEGATIVE 11/07/2018 1518   BILIRUBINUR NEGATIVE 11/07/2018 1518   KETONESUR NEGATIVE 11/07/2018 1518   PROTEINUR 100 (A) 11/07/2018 1518   UROBILINOGEN 1.0 08/19/2010 1827  NITRITE NEGATIVE 11/07/2018 1518   LEUKOCYTESUR TRACE (A) 11/07/2018 1518   Sepsis Labs: @LABRCNTIP (procalcitonin:4,lacticidven:4) ) Recent Results (from the past 240 hour(s))  Culture, blood (routine x 2)     Status: None (Preliminary result)   Collection Time: 11/07/18  2:15 PM  Result Value Ref Range Status   Specimen Description BLOOD SITE NOT SPECIFIED  Final   Special Requests   Final    BOTTLES DRAWN AEROBIC AND ANAEROBIC Blood Culture results may not be optimal due to an excessive volume of blood received in culture bottles   Culture   Final    NO GROWTH 2 DAYS Performed at [facility], [address] [place], [place] [address]    Report Status PENDING  Incomplete         Radiology Studies: Dg Chest 2 View  Result Date: 11/07/2018 CLINICAL DATA:  Shortness of breath and chest pain EXAM: CHEST - 2 VIEW COMPARISON:  October 14, 2016 FINDINGS: Degree of inspiration is shallow. There is bibasilar atelectasis. There is no edema or consolidation. There is cardiomegaly with pulmonary vascularity normal. No adenopathy. No bone lesions. IMPRESSION: Bibasilar atelectasis. No frank edema or consolidation. There is cardiomegaly. Electronically Signed   By: Bretta BangWilliam  Woodruff III M.D.   On: 11/07/2018 14:33   Ct Angio Chest Pe W/cm &/or Wo Cm  Result Date:  11/07/2018 CLINICAL DATA:  33 y/o M; shortness of breath, cough, tachycardia, chest pain. EXAM: CT ANGIOGRAPHY CHEST WITH CONTRAST TECHNIQUE: Multidetector CT imaging of the chest was performed using the standard protocol during bolus administration of intravenous contrast. Multiplanar CT image reconstructions and MIPs were obtained to evaluate the vascular anatomy. CONTRAST:  100mL ISOVUE-370 IOPAMIDOL (ISOVUE-370) INJECTION 76% COMPARISON:  03/18/2016 CT chest. FINDINGS: Cardiovascular: Stable cardiomegaly. No pericardial effusion. Normal caliber thoracic aorta and main pulmonary artery. Satisfactory opacification of the pulmonary arteries mild respiratory motion artifact at the lung bases. Mediastinum/Nodes: No enlarged mediastinal, hilar, or axillary lymph nodes. Thyroid gland, trachea, and esophagus demonstrate no significant findings. Lungs/Pleura: 5 mm right upper lobe pulmonary nodule (series 8, image 102). Peribronchial thickening and minor atelectasis in the lung bases. No consolidation, effusion, or pneumothorax. Upper Abdomen: No acute abnormality. Musculoskeletal: No chest wall abnormality. No acute or significant osseous findings. Review of the MIP images confirms the above findings. IMPRESSION: 1. Mild respiratory motion artifact at the lung bases. No pulmonary embolus identified. 2. Peribronchial thickening in the lung bases may reflect vascular congestion, bronchitis, or reactive airway disease. No consolidation. 3. 5 mm right upper lobe pulmonary nodule. No follow-up needed if patient is low-risk. Non-contrast chest CT can be considered in 12 months if patient is high-risk. This recommendation follows the consensus statement: Guidelines for Management of Incidental Pulmonary Nodules Detected on CT Images: From the Fleischner Society 2017; Radiology 2017; 284:228-243. 4. Stable cardiomegaly. Electronically Signed   By: Mitzi HansenLance  Furusawa-Stratton M.D.   On: 11/07/2018 17:06   [place] Abdomen Limited  Ruq  Result Date: 11/07/2018 CLINICAL DATA:  Elevated liver function tests. Chest pain. Dyspnea. EXAM: ULTRASOUND ABDOMEN LIMITED RIGHT UPPER QUADRANT COMPARISON:  None. FINDINGS: Gallbladder: Multiple layering shadowing gallstones within the nondistended gallbladder. No definite gallbladder wall thickening. No pericholecystic fluid. No sonographic Murphy sign. Common bile duct: Diameter: 5 mm Liver: No focal lesion identified. Within normal limits in parenchymal echogenicity. Portal vein is patent on color Doppler imaging with normal direction of blood flow towards the liver. Small right pleural effusion noted. IMPRESSION: 1. Cholelithiasis.  No evidence of acute cholecystitis.  2. No biliary ductal dilatation. 3. Normal liver. 4. Small right pleural effusion incidentally noted. Electronically Signed   By: Delbert Phenix M.D.   On: 11/07/2018 20:30      Scheduled Meds: . dextromethorphan-guaiFENesin  2 tablet Oral BID  . enoxaparin (LOVENOX) injection  40 mg Subcutaneous Daily  . insulin aspart  0-9 Units Subcutaneous TID WC  . methylPREDNISolone (SOLU-MEDROL) injection  40 mg Intravenous Q12H  . metoprolol tartrate  100 mg Oral BID   Continuous Infusions:   LOS: 1 day      Calvert Cantor, MD [facility] Pager: [email or web address] Password [email or web address] 11/09/2018, 1:37 PM

## 2018-11-09 NOTE — Progress Notes (Signed)
Notified Dr. Butler Denmark of ABG result. Cont to monitor. Emelda Brothers RN

## 2018-11-09 NOTE — Progress Notes (Signed)
SATURATION QUALIFICATIONS: (This note is used to comply with regulatory documentation for home oxygen)  Patient Saturations on Room Air at Rest = 78%  Patient Saturations on Room Air while Ambulating = N/A  Patient Saturations on 3 Liters of oxygen while sitting = 87-94%  Please briefly explain why patient needs home oxygen:

## 2018-11-09 NOTE — Care Management Note (Signed)
Case Management Note  Patient Details  Name: Edward Castro MRN: [id number] Date of Birth: October 19, 1986  Subjective/Objective:  Pt presented for   Acute Respiratory Failure with Hypoxia. [place]               Action/Plan: Staff RN ambulated patient and saturations dropped to 79% while ambulating. CM did speak with patient about compliance with oxygen and any other DME needed CPAP vs Trilogy Vent. CM was able to speak with Maralyn Sago Pulmonary NP- she explained to patient about CPAP vs Trilogy and weight loss. If patient needs DME Referral was called to [facility]- he is following for any DME needs. ABG ordered via Maralyn Sago Pulmonary NP.   Expected Discharge Date:                  Expected Discharge Plan:  Home/Self Care  In-House Referral:  NA  Discharge planning Services  CM Consult  Post Acute Care Choice:  Durable Medical Equipment Choice offered to:  Patient  DME Arranged:    DME Agency:  [facility] NA  HH Arranged:  NA HH Agency:  NA  Status of Service:  In process, will continue to follow  If discussed at Long Length of Stay Meetings, dates discussed:    Additional Comments:  Gala Lewandowsky, RN 11/09/2018, 3:04 PM

## 2018-11-10 DIAGNOSIS — E875 Hyperkalemia: Secondary | ICD-10-CM

## 2018-11-10 LAB — BASIC METABOLIC PANEL
Anion gap: 8 (ref 5–15)
Anion gap: 6 (ref 5–15)
BUN: 21 mg/dL — ABNORMAL HIGH (ref 6–20)
BUN: 20 mg/dL (ref 6–20)
CALCIUM: 9.5 mg/dL (ref 8.9–10.3)
CO2: 39 mmol/L — ABNORMAL HIGH (ref 22–32)
CO2: 45 mmol/L — ABNORMAL HIGH (ref 22–32)
CREATININE: 1.03 mg/dL (ref 0.61–1.24)
Calcium: 9.4 mg/dL (ref 8.9–10.3)
Chloride: 89 mmol/L — ABNORMAL LOW (ref 98–111)
Chloride: 91 mmol/L — ABNORMAL LOW (ref 98–111)
Creatinine, Ser: 1.08 mg/dL (ref 0.61–1.24)
GFR calc Af Amer: >60 mL/min (ref >60)
GFR calc Af Amer: >60 mL/min (ref >60)
GFR calc non Af Amer: >60 mL/min (ref >60)
GFR calc non Af Amer: >60 mL/min (ref >60)
Glucose, Bld: 123 mg/dL — ABNORMAL HIGH (ref 70–99)
Glucose, Bld: 113 mg/dL — ABNORMAL HIGH (ref 70–99)
Potassium: 5.5 mmol/L — ABNORMAL HIGH (ref 3.5–5.1)
Potassium: 5.3 mmol/L — ABNORMAL HIGH (ref 3.5–5.1)
Sodium: 136 mmol/L (ref 135–145)
Sodium: 142 mmol/L (ref 135–145)

## 2018-11-10 LAB — GLUCOSE, CAPILLARY
GLUCOSE-CAPILLARY: 279 mg/dL — AB (ref 70–99)
Glucose-Capillary: 177 mg/dL — ABNORMAL HIGH (ref 70–99)
Glucose-Capillary: 121 mg/dL — ABNORMAL HIGH (ref 70–99)
Glucose-Capillary: 181 mg/dL — ABNORMAL HIGH (ref 70–99)

## 2018-11-10 MED ORDER — ENOXAPARIN SODIUM 80 MG/0.8ML ~~LOC~~ SOLN
0.5000 mg/kg | Freq: Every day | SUBCUTANEOUS | Status: DC
  Administered 2018-11-11: 75 mg via SUBCUTANEOUS
  Filled 2018-11-10: qty 0.8

## 2018-11-10 MED ORDER — PATIROMER SORBITEX CALCIUM 8.4 G PO PACK
8.4000 g | PACK | Freq: Every day | ORAL | Status: DC
  Administered 2018-11-10: 8.4 g via ORAL
  Filled 2018-11-10 (×3): qty 1

## 2018-11-10 MED ORDER — SODIUM POLYSTYRENE SULFONATE 15 GM/60ML PO SUSP
30.0000 g | ORAL | Status: AC
  Administered 2018-11-10: 30 g via ORAL
  Filled 2018-11-10: qty 120

## 2018-11-10 NOTE — Progress Notes (Signed)
RT placed patient on CPAP HS. 4L O2 bleed in needed. Patient tolerating well at this time.  

## 2018-11-10 NOTE — Progress Notes (Signed)
RT called to pt room to place pt on CPAP per pt request. RT placed pt on CPAP. RT will continue to monitor.

## 2018-11-10 NOTE — Progress Notes (Addendum)
PROGRESS NOTE    Edward Castro   MPN:[id number]  DOB: 1986-05-02  DOA: 11/07/2018 PCP: Laqueta Due., MD   Brief Narrative:  Edward Castro is a 33 y.o. male with medical history significant of CHF,  type 2 diabetes, essential hypertension, pulmonary hypertension presenting to the hospital via EMS for evaluation of shortness of breath and chest pain.  Per EMS report, oxygen saturation 78% on room air.  He was placed on 4 L oxygen via nasal cannula. Sent from his PCP office at [facility] for hypoxia. CTA negative for PE but shows peribronchial thickening in the lung bases may reflect vascular congestion, bronchitis, or reactive airway disease. No consolidation.  Treated for CHF in hospital with Lasix and COPD exacerbation with IV steroids.   PCP note from 04/24/18 in care everywhere reviewed. Per note, he has chronic O2 dependence and has been non compliant with using is O2, following up to have a sleep study, taking his medications has never followed up with pulmonary. Per her notes, he feels he does not need O2.    Subjective: Cough continues to improve- coughing up clear sputum.    Assessment & Plan:   Principal Problem:   Acute respiratory failure with hypoxia - possibly acute bronchitis with underlying obesity hypoventilation - like has chronic respiratory failure (per PCP notes) related to his obesity- the patient told his PCP that he did not need O2 and although he has a tank at home, he does not use it- advised him that he needs to wear his O2 at all times - Cont Nebs - today needing 2 L O2 at rest and 3 L on exertion    Active Problems:   HTN (hypertension)   Acute on chronic diastolic heart failure - diuresed with IV Lasix- weight dropped from 159.2 to 151 kg - ECHO below is not a good study but EF is normal (may have diastolic CHF) - transition to home dose of Lasix tomorrow   Probable OSA - patient has never followed up for a sleep study- likely will need BiPAP at bedtime  rather than a CPAP- he will go home with a Trilogy vent on d/c and follow up with PCCM as outpt    Diabetes mellitus without complication  - HbA1c 7.0- Metformin at home- cont ISS  Chest pain - likely related to current cough  - mild elevation of troponin noted but flat trend- no need for further cardiac work up at this time  Hyperkalemia - K still 5.3 today despite treatment- follow up tomorrow-cont Veltessa   Polycythemia  - likely due to not consistently wearing his O2    Pulmonary nodule - needs outpt follow up  Lymphedema - elevate legs- place TEDS   Time spent in minutes:  30 min-    DVT prophylaxis: Lovenox Code Status: Full code Family Communication:  Disposition Plan: home tomorrow if K normalized Consultants:   PCCM Procedures:   2 d ECHO Study Conclusions  - Left ventricle: The cavity size was normal. Wall thickness was   increased in a pattern of mild LVH. Systolic function was normal.   The estimated ejection fraction was in the range of 55% to 60%.   The study is not technically sufficient to allow evaluation of LV   diastolic function. - Left atrium: The atrium was mildly dilated. - Right ventricle: Poorly visualized appears dilated - Impressions: No real apical images poor study.  Impressions:  - No real apical images poor study. Antimicrobials:  Anti-infectives (From  admission, onward)   None       Objective: Vitals:   11/09/18 1952 11/09/18 2200 11/10/18 0427 11/10/18 1337  BP: (!) 156/79 (!) 143/99 (!) 142/93 127/84  Pulse: 84 87 74 79  Resp: 19  17   Temp: 98.2 F (36.8 C)  (!) 97.5 F (36.4 C) 98.1 F (36.7 C)  TempSrc: Oral  Oral Oral  SpO2: 94%  95% 96%  Weight:   (!) 151.5 kg   Height:        Intake/Output Summary (Last 24 hours) at 11/10/2018 1900 Last data filed at 11/10/2018 1300 Gross per 24 hour  Intake 840 ml  Output 2300 ml  Net -1460 ml   Filed Weights   11/08/18 1215 11/09/18 0652 11/10/18 0427  Weight:  (!) 153.5 kg (!) 152 kg (!) 151.5 kg    Examination: General exam: Appears comfortable  HEENT: PERRLA, oral mucosa moist, no sclera icterus or thrush Respiratory system: Clear to auscultation. Respiratory effort normal. Cardiovascular system: S1 & S2 heard,  No murmurs  Gastrointestinal system: Abdomen soft, non-tender, nondistended. Normal bowel sound. No organomegaly Central nervous system: Alert and oriented. No focal neurological deficits. Extremities: No cyanosis, clubbing or edema Skin: No rashes or ulcers Psychiatry:  Mood & affect appropriate.      Data Reviewed: I have personally reviewed following labs and imaging studies  CBC: Recent Labs  Lab 11/07/18 1246 11/09/18 0434  WBC 10.0 10.9*  NEUTROABS 7.8* 9.4*  HGB 19.6* 18.7*  HCT 66.1* 64.4*  MCV 100.9* 102.2*  PLT 154 158   Basic Metabolic Panel: Recent Labs  Lab 11/07/18 1246 11/09/18 0434 11/10/18 0333 11/10/18 1809  NA 142 140 136 142  K 4.5 5.3* 5.5* 5.3*  CL 98 92* 89* 91*  CO2 35* 43* 39* 45*  GLUCOSE 94 123* 123* 113*  BUN 16 18 21* 20  CREATININE 1.28* 1.15 1.03 1.08  CALCIUM 8.9 9.1 9.5 9.4   GFR: Estimated Creatinine Clearance: 139.8 mL/min (by C-G formula based on SCr of 1.08 mg/dL). Liver Function Tests: Recent Labs  Lab 11/07/18 1246 11/09/18 0434  AST 48* 28  ALT 54* 35  ALKPHOS 62 48  BILITOT 0.8 1.1  PROT 7.1 6.5  ALBUMIN 2.9* 2.6*   Recent Labs  Lab 11/07/18 1246  LIPASE 29   No results for input(s): AMMONIA in the last 168 hours. Coagulation Profile: Recent Labs  Lab 11/07/18 1500  INR 1.02   Cardiac Enzymes: Recent Labs  Lab 11/07/18 1914 11/08/18 0314 11/08/18 0940  TROPONINI 0.12* 0.10* 0.09*   BNP (last 3 results) No results for input(s): PROBNP in the last 8760 hours. HbA1C: Recent Labs    11/09/18 0434  HGBA1C 7.0*   CBG: Recent Labs  Lab 11/09/18 1609 11/09/18 2036 11/10/18 0718 11/10/18 1113 11/10/18 1614  GLUCAP 196* 162* 177* 279*  121*   Lipid Profile: No results for input(s): CHOL, HDL, LDLCALC, TRIG, CHOLHDL, LDLDIRECT in the last 72 hours. Thyroid Function Tests: No results for input(s): TSH, T4TOTAL, FREET4, T3FREE, THYROIDAB in the last 72 hours. Anemia Panel: No results for input(s): VITAMINB12, FOLATE, FERRITIN, TIBC, IRON, RETICCTPCT in the last 72 hours. Urine analysis:    Component Value Date/Time   COLORURINE YELLOW 11/07/2018 1518   APPEARANCEUR CLEAR 11/07/2018 1518   LABSPEC 1.018 11/07/2018 1518   PHURINE 6.0 11/07/2018 1518   GLUCOSEU NEGATIVE 11/07/2018 1518   HGBUR NEGATIVE 11/07/2018 1518   BILIRUBINUR NEGATIVE 11/07/2018 1518   KETONESUR NEGATIVE 11/07/2018 1518  PROTEINUR 100 (A) 11/07/2018 1518   UROBILINOGEN 1.0 08/19/2010 1827   NITRITE NEGATIVE 11/07/2018 1518   LEUKOCYTESUR TRACE (A) 11/07/2018 1518   Sepsis Labs: @LABRCNTIP (procalcitonin:4,lacticidven:4) ) Recent Results (from the past 240 hour(s))  Culture, blood (routine x 2)     Status: None (Preliminary result)   Collection Time: 11/07/18  2:15 PM  Result Value Ref Range Status   Specimen Description BLOOD SITE NOT SPECIFIED  Final   Special Requests   Final    BOTTLES DRAWN AEROBIC AND ANAEROBIC Blood Culture results may not be optimal due to an excessive volume of blood received in culture bottles   Culture   Final    NO GROWTH 3 DAYS Performed at [facility], [address] [place], [place] [address]    Report Status PENDING  Incomplete  Respiratory Panel by PCR     Status: None   Collection Time: 11/09/18  2:07 PM  Result Value Ref Range Status   Adenovirus NOT DETECTED NOT DETECTED Final   Coronavirus 229E NOT DETECTED NOT DETECTED Final   Coronavirus HKU1 NOT DETECTED NOT DETECTED Final   Coronavirus NL63 NOT DETECTED NOT DETECTED Final   Coronavirus OC43 NOT DETECTED NOT DETECTED Final   Metapneumovirus NOT DETECTED NOT DETECTED Final   Rhinovirus / Enterovirus NOT DETECTED NOT DETECTED Final    Influenza A NOT DETECTED NOT DETECTED Final   Influenza B NOT DETECTED NOT DETECTED Final   Parainfluenza Virus 1 NOT DETECTED NOT DETECTED Final   Parainfluenza Virus 2 NOT DETECTED NOT DETECTED Final   Parainfluenza Virus 3 NOT DETECTED NOT DETECTED Final   Parainfluenza Virus 4 NOT DETECTED NOT DETECTED Final   Respiratory Syncytial Virus NOT DETECTED NOT DETECTED Final   Bordetella pertussis NOT DETECTED NOT DETECTED Final   Chlamydophila pneumoniae NOT DETECTED NOT DETECTED Final   Mycoplasma pneumoniae NOT DETECTED NOT DETECTED Final    Comment: Performed at [facility], [address] [place], [place] [address]         Radiology Studies: Dg Chest Port 1 View  Result Date: 11/09/2018 CLINICAL DATA:  Decreased oxygen saturation EXAM: PORTABLE CHEST 1 VIEW COMPARISON:  Chest radiograph and chest CT November 07, 2018 FINDINGS: There is generalized cardiomegaly. There is no appreciable edema or consolidation. No evident adenopathy. No bone lesions. IMPRESSION: Stable generalized cardiomegaly.  No edema or consolidation. Electronically Signed   By: Bretta BangWilliam  Woodruff III M.D.   On: 11/09/2018 15:26      Scheduled Meds: . dextromethorphan-guaiFENesin  2 tablet Oral BID  . [START ON 11/11/2018] enoxaparin (LOVENOX) injection  0.5 mg/kg Subcutaneous Daily  . insulin aspart  0-9 Units Subcutaneous TID WC  . metoprolol tartrate  100 mg Oral BID  . patiromer  8.4 g Oral Daily   Continuous Infusions:   LOS: 2 days      Calvert CantorSaima Yader Criger, MD [facility] Pager: [email or web address] Password [email or web address] 11/10/2018, 7:00 PM

## 2018-11-10 NOTE — Care Management (Signed)
Patient continues to exhibit signs of hypercapnia associated with chronic respiratory failure secondary to severe COPD. Patient requires the use of NIV both at sleep and in the daytime to help with exacerbation periods. The use of the NIV will treat both the patients high PCO2 levels and can reduce the risk of exacerbations and future hospitalizations when used at night and during the day. The patient will need these advanced settings in conjunction with their current medication regimen; BIPAP is not an option due to its functional limitations and the severity of the patient's condition. Failure to have NIV available for use over a 24 hour period could lead to death. 

## 2018-11-10 NOTE — Care Management (Signed)
Patient has oxygen in room. Per Vaughan Basta w [facility], NIV will be delivered to patient's home tonight. Spoke w Dr Butler Denmark who states DC is pending 4pm labs. Will procede with having equipment delivered to home tonight, [facility] will call patient to set up time. [facility] notified that patient will DC today. No other CM needs.

## 2018-11-11 DIAGNOSIS — J9611 Chronic respiratory failure with hypoxia: Secondary | ICD-10-CM

## 2018-11-11 LAB — BASIC METABOLIC PANEL
Anion gap: 9 (ref 5–15)
BUN: 22 mg/dL — ABNORMAL HIGH (ref 6–20)
CO2: 42 mmol/L — AB (ref 22–32)
Calcium: 9.1 mg/dL (ref 8.9–10.3)
Chloride: 90 mmol/L — ABNORMAL LOW (ref 98–111)
Creatinine, Ser: 1.08 mg/dL (ref 0.61–1.24)
GFR calc Af Amer: >60 mL/min (ref >60)
GFR calc non Af Amer: >60 mL/min (ref >60)
Glucose, Bld: 101 mg/dL — ABNORMAL HIGH (ref 70–99)
Potassium: 4 mmol/L (ref 3.5–5.1)
Sodium: 141 mmol/L (ref 135–145)

## 2018-11-11 LAB — GLUCOSE, CAPILLARY: Glucose-Capillary: 115 mg/dL — ABNORMAL HIGH (ref 70–99)

## 2018-11-11 MED ORDER — DM-GUAIFENESIN ER 30-600 MG PO TB12: 2.0000 | ORAL_TABLET | Freq: Two times a day (BID) | ORAL | 0 refills | Status: DC | PRN

## 2018-11-11 NOTE — Discharge Instructions (Signed)

## 2018-11-11 NOTE — Discharge Summary (Signed)
Physician Discharge Summary  Edward Castro ZOX:[id number] DOB: [birth year] DOA: 11/07/2018  PCP: Laqueta Due., MD  Admit date: 11/07/2018 Discharge date: 11/11/2018  Admitted From: home Disposition:  home   Recommendations for Outpatient Follow-up:  1. Continue to encourage weight loss 2. Follow fluid status carefully, may need to increase diuretics- will likely need further education as well  Discharge Condition:  stable   CODE STATUS:  Full code   Consultations:  PCCM   Discharge Diagnoses:  Principal Problem:   Acute respiratory failure with hypoxia (HCC) Active Problems:   Acute bronchitis   Polycythemia   Pulmonary nodule   HTN (hypertension)   Acute on chronic diastolic heart failure (HCC)   Diabetes mellitus without complication (HCC)  h/o DVT (deep venous thrombosis) (HCC)     Brief Summary: Edward Castro is a 33 y.o.malewith medical history significant ofCHF,  type 2 diabetes, essential hypertension, pulmonary hypertension presenting tohis PCP [facility])  for evaluation of shortness of breath and chest pain. Sent from his PCP's office at [facility] for hypoxia. Per EMS report, oxygen saturation 78% on room air, 90 on 3 L.   CTA negative for PE but shows peribronchial thickening in the lung bases may reflect vascular congestion, bronchitis, or reactive airway disease. No consolidation.  Treated for CHF in hospital with Lasix and COPD exacerbation with IV steroids.   PCP's Ninetta Lights) note from 04/24/18 in care everywhere reviewed. Per note, he has chronic O2 dependence and has been non compliant with using his O2. He did not have his O@ on when he went to see his PCP for above mentioned cough. He has further been noncompliant with following up to have a sleep study & taking his medications, Per her notes, he feels he does not need O2.    In ED >  CXR: Bibasilar atelectasis. No frank edema or consolidation. There is cardiomegaly. CTA: Peribronchial thickening in the lung bases  may reflect vascular congestion, bronchitis, or reactive airway disease. No consolidation. 5 mm right upper lobe pulmonary nodule Per history> Reports having an episode of chest pain a week ago while walking which resolved in 10 minutes with rest and taking an aspirin.  He describes the chest pain as substernal, nonradiating, and feels like tightness.  States he has had a few more intermittent episodes of chest pain since then.  - Was on 3 L O2 in ED.   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  Acute on chronic diastolic heart failure - possible pulmonary congestion noted on imaging - diuresed with IV Lasix- weight dropped from 159.2 to 151 kg - ECHO below is not a good study but EF is normal (may have diastolic CHF) - transition to home dose of Lasix today -  educated patient on monitoring oral fluid intake, weighing himself daily and watching closely for fluid gain - cont Metoprolol 100 BID   Probable OSA - patient has never followed up for a sleep study- likely will need BiPAP at bedtime rather than a CPAP - as this point as he has not had a sleep study, he will go home with a Trilogy vent and follow up with pulm    Diabetes mellitus without complication  - HbA1c 7.0- Metformin at home-  Diet control  Chest  pain - likely related to current cough  - mild elevation of troponin noted but flat trend- no need for further cardiac work up at this time  Hyperkalemia - K rose to as high as 5.5 - no cause found- has improved to 4.0 with treatment- has not prior h/o hyperkalemia per outpt labs   Polycythemia  - correlates with and support the suspicion that he has chronic hypoxemia/not consistently wearing O2 as outpt- dicussed with patient    Pulmonary nodule - see imaging - needs outpt follow up  H/o Lymphedema - has resolved in the hospital - elevate legs- placed TEDS- d/w with patient   Super morbid obesity Body mass index is 50.4 kg/m. - necessity of weight loss discussed   Discharge Exam: Vitals:   11/10/18 1957 11/11/18 0347  BP: 120/69 122/74  Pulse: 90 78  Resp: 16 14  Temp: (!) 97.4 F (36.3 C) 98.2 F (36.8 C)  SpO2: 96% 93%   Vitals:   11/10/18 0427 11/10/18 1337 11/10/18 1957 11/11/18 0347  BP: (!) 142/93 127/84 120/69 122/74  Pulse: 74 79 90 78  Resp: 17  16 14   Temp: (!) 97.5 F (36.4 C) 98.1 F (36.7 C) (!) 97.4 F (36.3 C) 98.2 F (36.8 C)  TempSrc: Oral Oral Oral Axillary  SpO2: 95% 96% 96% 93%  Weight: (!) 151.5 kg   (!) 150.4 kg  Height:        General: Pt is alert, awake, not in acute distress, morbidly obese Cardiovascular: RRR, S1/S2 +, no rubs, no gallops Respiratory: CTA bilaterally, no wheezing, no rhonchi Abdominal: Soft, NT, ND, bowel sounds + Extremities: no edema, no cyanosis   Discharge Instructions  Discharge Instructions    (HEART FAILURE PATIENTS) Call MD:  Anytime you have any of the following symptoms: 1) 3 pound weight gain in 24 hours or 5 pounds in 1 week 2) shortness of breath, with or without a dry hacking cough 3) swelling in the hands, feet or stomach 4) if you have to sleep on extra pillows at night in order to breathe.   Complete by:  As directed    Diet - low sodium heart healthy   Complete by:  As directed    Diet  Carb Modified   Complete by:  As directed    Increase activity slowly   Complete by:  As directed      Allergies as of 11/11/2018   No Known Allergies     Medication List    TAKE these medications   dextromethorphan-guaiFENesin 30-600 MG 12hr tablet Commonly known as:  MUCINEX DM Take 2 tablets by mouth 2 (two) times daily as needed for cough (and mucous).   furosemide 40 MG tablet Commonly known as:  LASIX Take 40 mg by mouth daily.   metFORMIN 500 MG tablet Commonly known as:  GLUCOPHAGE Take 500 mg by mouth daily.   metoprolol tartrate 100 MG tablet Commonly known as:  LOPRESSOR Take 100 mg  by mouth 2 (two) times daily.            Durable Medical Equipment  (From admission, onward)         Start     Ordered   11/10/18 1003  For home use only DME oxygen  Once    Comments:  Needs 2 L at rest and 3 with exertion  Question Answer Comment  Mode or (Route) Nasal cannula   Liters per Minute 3   Frequency Continuous (stationary and portable oxygen unit needed)   Oxygen conserving device Yes   Oxygen delivery system Gas      11/10/18 1002         Follow-up Information    Coral Ceo, NP Follow up on 11/16/2018.   Specialty:  Pulmonary Disease Why:  Appointment is at 2 pm, arrive at 1:45. Thanks so much Contact information: [address] [place] [address] [phone]        Laqueta Due., MD. Schedule an appointment as soon as possible for a visit.   Specialty:  Internal Medicine Why:  follow up in 1 wk with your PCP Contact information: [address] [place] [address] [phone]          No Known Allergies   Procedures/Studies:  2 d ECHO Study Conclusions  - Left ventricle: The cavity size was normal. Wall thickness was increased in a pattern of mild LVH. Systolic function was normal. The estimated ejection fraction was in the range of 55% to 60%. The study is not technically sufficient to  allow evaluation of LV diastolic function. - Left atrium: The atrium was mildly dilated. - Right ventricle: Poorly visualized appears dilated - Impressions: No real apical images poor study.  Impressions:  - No real apical images poor study.  Dg Chest 2 View  Result Date: 11/07/2018 CLINICAL DATA:  Shortness of breath and chest pain EXAM: CHEST - 2 VIEW COMPARISON:  October 14, 2016 FINDINGS: Degree of inspiration is shallow. There is bibasilar atelectasis. There is no edema or consolidation. There is cardiomegaly with pulmonary vascularity normal. No adenopathy. No bone lesions. IMPRESSION: Bibasilar atelectasis. No frank edema or consolidation. There is cardiomegaly. Electronically Signed   By: Bretta Bang III M.D.   On: 11/07/2018 14:33   Ct Angio Chest Pe W/cm &/or Wo Cm  Result Date: 11/07/2018 CLINICAL DATA:  34 y/o M; shortness of breath, cough, tachycardia, chest pain. EXAM: CT ANGIOGRAPHY CHEST WITH CONTRAST TECHNIQUE: Multidetector CT imaging of the chest was performed using the standard protocol during bolus administration of intravenous contrast. Multiplanar CT image reconstructions and MIPs were obtained to evaluate the vascular anatomy. CONTRAST:  ISOVUE-370 IOPAMIDOL (ISOVUE-370) INJECTION 76% COMPARISON:  03/18/2016 CT chest. FINDINGS: Cardiovascular: Stable cardiomegaly. No pericardial effusion. Normal caliber thoracic aorta and main pulmonary artery. Satisfactory opacification of the pulmonary arteries mild respiratory motion artifact at the lung bases. Mediastinum/Nodes: No enlarged mediastinal, hilar, or axillary lymph nodes. Thyroid gland, trachea, and esophagus demonstrate no significant findings. Lungs/Pleura: 5 mm right upper lobe pulmonary nodule (series 8, image 102). Peribronchial thickening and minor atelectasis in the lung bases. No consolidation, effusion, or pneumothorax. Upper Abdomen: No acute abnormality. Musculoskeletal: No chest wall abnormality. No  acute or significant osseous findings. Review of the MIP images confirms the above findings. IMPRESSION: 1. Mild respiratory motion artifact at the lung bases. No pulmonary embolus identified. 2. Peribronchial thickening in the lung bases may reflect vascular congestion, bronchitis, or reactive  airway disease. No consolidation. 3. 5 mm right upper lobe pulmonary nodule. No follow-up needed if patient is low-risk. Non-contrast chest CT can be considered in 12 months if patient is high-risk. This recommendation follows the consensus statement: Guidelines for Management of Incidental Pulmonary Nodules Detected on CT Images: From the Fleischner Society 2017; Radiology 2017; 284:228-243. 4. Stable cardiomegaly. Electronically Signed   By: Mitzi Hansen M.D.   On: 11/07/2018 17:06   Dg Chest Port 1 View  Result Date: 11/09/2018 CLINICAL DATA:  Decreased oxygen saturation EXAM: PORTABLE CHEST 1 VIEW COMPARISON:  Chest radiograph and chest CT November 07, 2018 FINDINGS: There is generalized cardiomegaly. There is no appreciable edema or consolidation. No evident adenopathy. No bone lesions. IMPRESSION: Stable generalized cardiomegaly.  No edema or consolidation. Electronically Signed   By: Bretta Bang III M.D.   On: 11/09/2018 15:26   US Abdomen Limited Ruq  Result Date: 11/07/2018 CLINICAL DATA:  Elevated liver function tests. Chest pain. Dyspnea. EXAM: ULTRASOUND ABDOMEN LIMITED RIGHT UPPER QUADRANT COMPARISON:  None. FINDINGS: Gallbladder: Multiple layering shadowing gallstones within the nondistended gallbladder. No definite gallbladder wall thickening. No pericholecystic fluid. No sonographic Murphy sign. Common bile duct: Diameter: 5 mm Liver: No focal lesion identified. Within normal limits in parenchymal echogenicity. Portal vein is patent on color Doppler imaging with normal direction of blood flow towards the liver. Small right pleural effusion noted. IMPRESSION: 1. Cholelithiasis.  No  evidence of acute cholecystitis. 2. No biliary ductal dilatation. 3. Normal liver. 4. Small right pleural effusion incidentally noted. Electronically Signed   By: Delbert Phenix M.D.   On: 11/07/2018 20:30     The results of significant diagnostics from this hospitalization (including imaging, microbiology, ancillary and laboratory) are listed below for reference.     Microbiology: Recent Results (from the past 240 hour(s))  Culture, blood (routine x 2)     Status: None (Preliminary result)   Collection Time: 11/07/18  2:15 PM  Result Value Ref Range Status   Specimen Description BLOOD SITE NOT SPECIFIED  Final   Special Requests   Final    BOTTLES DRAWN AEROBIC AND ANAEROBIC Blood Culture results may not be optimal due to an excessive volume of blood received in culture bottles   Culture   Final    NO GROWTH 4 DAYS Performed at [facility], [address], [place], [place] [address]    Report Status PENDING  Incomplete  Respiratory Panel by PCR     Status: None   Collection Time: 11/09/18  2:07 PM  Result Value Ref Range Status   Adenovirus NOT DETECTED NOT DETECTED Final   Coronavirus 229E NOT DETECTED NOT DETECTED Final   Coronavirus HKU1 NOT DETECTED NOT DETECTED Final   Coronavirus NL63 NOT DETECTED NOT DETECTED Final   Coronavirus OC43 NOT DETECTED NOT DETECTED Final   Metapneumovirus NOT DETECTED NOT DETECTED Final   Rhinovirus / Enterovirus NOT DETECTED NOT DETECTED Final   Influenza A NOT DETECTED NOT DETECTED Final   Influenza B NOT DETECTED NOT DETECTED Final   Parainfluenza Virus 1 NOT DETECTED NOT DETECTED Final   Parainfluenza Virus 2 NOT DETECTED NOT DETECTED Final   Parainfluenza Virus 3 NOT DETECTED NOT DETECTED Final   Parainfluenza Virus 4 NOT DETECTED NOT DETECTED Final   Respiratory Syncytial Virus NOT DETECTED NOT DETECTED Final   Bordetella pertussis NOT DETECTED NOT DETECTED Final   Chlamydophila pneumoniae NOT DETECTED NOT DETECTED Final    Mycoplasma pneumoniae NOT DETECTED NOT DETECTED  Final    Comment: Performed at [facility], [address] [place], [place] [address]     Labs: BNP (last 3 results) Recent Labs    11/07/18 1246  BNP 162.6*   Basic Metabolic Panel: Recent Labs  Lab 11/07/18 1246 11/09/18 0434 11/10/18 0333 11/10/18 1809 11/11/18 0343  NA 142 140 136 142 141  K 4.5 5.3* 5.5* 5.3* 4.0  CL 98 92* 89* 91* 90*  CO2 35* 43* 39* 45* 42*  GLUCOSE 94 123* 123* 113* 101*  BUN 16 18 21* 20 22*  CREATININE 1.28* 1.15 1.03 1.08 1.08  CALCIUM 8.9 9.1 9.5 9.4 9.1   Liver Function Tests: Recent Labs  Lab 11/07/18 1246 11/09/18 0434  AST 48* 28  ALT 54* 35  ALKPHOS 62 48  BILITOT 0.8 1.1  PROT 7.1 6.5  ALBUMIN 2.9* 2.6*   Recent Labs  Lab 11/07/18 1246  LIPASE 29   No results for input(s): AMMONIA in the last 168 hours. CBC: Recent Labs  Lab 11/07/18 1246 11/09/18 0434  WBC 10.0 10.9*  NEUTROABS 7.8* 9.4*  HGB 19.6* 18.7*  HCT 66.1* 64.4*  MCV 100.9* 102.2*  PLT 154 158   Cardiac Enzymes: Recent Labs  Lab 11/07/18 1914 11/08/18 0314 11/08/18 0940  TROPONINI 0.12* 0.10* 0.09*   BNP: Invalid input(s): POCBNP CBG: Recent Labs  Lab 11/10/18 0718 11/10/18 1113 11/10/18 1614 11/10/18 2110 11/11/18 0722  GLUCAP 177* 279* 121* 181* 115*   D-Dimer No results for input(s): DDIMER in the last 72 hours. Hgb A1c Recent Labs    11/09/18 0434  HGBA1C 7.0*   Lipid Profile No results for input(s): CHOL, HDL, LDLCALC, TRIG, CHOLHDL, LDLDIRECT in the last 72 hours. Thyroid function studies No results for input(s): TSH, T4TOTAL, T3FREE, THYROIDAB in the last 72 hours.  Invalid input(s): FREET3 Anemia work up No results for input(s): VITAMINB12, FOLATE, FERRITIN, TIBC, IRON, RETICCTPCT in the last 72 hours. Urinalysis    Component Value Date/Time   COLORURINE YELLOW 11/07/2018 1518   APPEARANCEUR CLEAR 11/07/2018 1518   LABSPEC 1.018 11/07/2018 1518   PHURINE 6.0  11/07/2018 1518   GLUCOSEU NEGATIVE 11/07/2018 1518   HGBUR NEGATIVE 11/07/2018 1518   BILIRUBINUR NEGATIVE 11/07/2018 1518   KETONESUR NEGATIVE 11/07/2018 1518   PROTEINUR 100 (A) 11/07/2018 1518   UROBILINOGEN 1.0 08/19/2010 1827   NITRITE NEGATIVE 11/07/2018 1518   LEUKOCYTESUR TRACE (A) 11/07/2018 1518   Sepsis Labs Invalid input(s): PROCALCITONIN,  WBC,  LACTICIDVEN Microbiology Recent Results (from the past 240 hour(s))  Culture, blood (routine x 2)     Status: None (Preliminary result)   Collection Time: 11/07/18  2:15 PM  Result Value Ref Range Status   Specimen Description BLOOD SITE NOT SPECIFIED  Final   Special Requests   Final    BOTTLES DRAWN AEROBIC AND ANAEROBIC Blood Culture results may not be optimal due to an excessive volume of blood received in culture bottles   Culture   Final    NO GROWTH 4 DAYS Performed at [facility], [address] [place], [place] [address]    Report Status PENDING  Incomplete  Respiratory Panel by PCR     Status: None   Collection Time: 11/09/18  2:07 PM  Result Value Ref Range Status   Adenovirus NOT DETECTED NOT DETECTED Final   Coronavirus 229E NOT DETECTED NOT DETECTED Final   Coronavirus HKU1 NOT DETECTED NOT DETECTED Final   Coronavirus NL63 NOT DETECTED NOT DETECTED Final  Coronavirus OC43 NOT DETECTED NOT DETECTED Final   Metapneumovirus NOT DETECTED NOT DETECTED Final   Rhinovirus / Enterovirus NOT DETECTED NOT DETECTED Final   Influenza A NOT DETECTED NOT DETECTED Final   Influenza B NOT DETECTED NOT DETECTED Final   Parainfluenza Virus 1 NOT DETECTED NOT DETECTED Final   Parainfluenza Virus 2 NOT DETECTED NOT DETECTED Final   Parainfluenza Virus 3 NOT DETECTED NOT DETECTED Final   Parainfluenza Virus 4 NOT DETECTED NOT DETECTED Final   Respiratory Syncytial Virus NOT DETECTED NOT DETECTED Final   Bordetella pertussis NOT DETECTED NOT DETECTED Final   Chlamydophila pneumoniae NOT DETECTED NOT DETECTED Final    Mycoplasma pneumoniae NOT DETECTED NOT DETECTED Final    Comment: Performed at [facility], [address] [place], [place] [address]     Time coordinating discharge in minutes: 65  SIGNED:   Calvert Cantor, MD  [facility] 11/11/2018, 9:50 AM Pager   If 7PM-7AM, please contact night-coverage [email or web address] Password [email or web address]

## 2018-11-11 NOTE — Care Management (Signed)
[facility] did not deliver NIV last night but spoke with family member to advise that NIV will be delivered on day of d/c.  Family should expect to be contacted today after d/c to arrange for delivery of NIV to home.

## 2018-11-11 NOTE — Plan of Care (Signed)
  Problem: Education: Goal: Knowledge of General Education information will improve Description Including pain rating scale, medication(s)/side effects and non-pharmacologic comfort measures Outcome: Completed/Met   Problem: Education: Goal: Ability to demonstrate management of disease process will improve Outcome: Completed/Met Goal: Ability to verbalize understanding of medication therapies will improve Outcome: Completed/Met Goal: Individualized Educational Video(s) Outcome: Completed/Met   Problem: Activity: Goal: Capacity to carry out activities will improve Outcome: Completed/Met   Problem: Cardiac: Goal: Ability to achieve and maintain adequate cardiopulmonary perfusion will improve Outcome: Completed/Met

## 2018-11-12 LAB — CULTURE, BLOOD (ROUTINE X 2): CULTURE: NO GROWTH

## 2018-11-16 ENCOUNTER — Encounter: Payer: Self-pay | Admitting: Pulmonary Disease

## 2018-11-16 ENCOUNTER — Encounter: Payer: Self-pay | Admitting: *Deleted

## 2018-11-16 ENCOUNTER — Ambulatory Visit (INDEPENDENT_AMBULATORY_CARE_PROVIDER_SITE_OTHER): Payer: BLUE CROSS/BLUE SHIELD | Admitting: Pulmonary Disease

## 2018-11-16 VITALS — BP 128/84 | HR 100 | Ht 67.5 in | Wt 330.2 lb

## 2018-11-16 DIAGNOSIS — R609 Edema, unspecified: Secondary | ICD-10-CM | POA: Diagnosis not present

## 2018-11-16 DIAGNOSIS — R29818 Other symptoms and signs involving the nervous system: Secondary | ICD-10-CM | POA: Insufficient documentation

## 2018-11-16 DIAGNOSIS — Z23 Encounter for immunization: Secondary | ICD-10-CM

## 2018-11-16 DIAGNOSIS — I5033 Acute on chronic diastolic (congestive) heart failure: Secondary | ICD-10-CM

## 2018-11-16 DIAGNOSIS — J9612 Chronic respiratory failure with hypercapnia: Secondary | ICD-10-CM

## 2018-11-16 DIAGNOSIS — J9611 Chronic respiratory failure with hypoxia: Secondary | ICD-10-CM | POA: Insufficient documentation

## 2018-11-16 NOTE — Assessment & Plan Note (Addendum)
Assessment: BMI 50.95 Hypercarbia and last hospitalization Managed on BiPAP and discharged home on trilogy ventilator Last CO2 on 11/15/2018 is 35 and has decreased from 42 at discharge from hospital Managed on 2 L via nasal cannula 24/7  Plan: Continue oxygen as prescribed Walk in office today for POC >>> Qualify for 3 L pulsed on walk today Maintain trilogy ventilator at this time We will order split-night sleep study to further evaluate for obstructive sleep apnea and obesity hypoventilation syndrome Hold off on lab work or ABG as CO2 is decreasing and patient is asymptomatic Establish with Dr. Wynona Neat for sleep and pulmonary management Flu vaccine today Pneumovax 23 today

## 2018-11-16 NOTE — Progress Notes (Signed)
'@Patient'  ID: Edward Castro, male    DOB: [birth year], 33 y.o.   MRN: [id number]  Chief Complaint  Patient presents with  . Hospitalization Follow-up    Acute on Chronic Resp Failure follow up     Referring provider: Karleen Hampshire., MD  HPI:  33 year old male never smoker initially consulted with our practice on 11/09/2018 when inpatient for acute on chronic respiratory failure with hypercarbia.  Patient had previously been managed at [facility] pulmonary group.  PMH: Morbid obesity Smoker/ Smoking History: Never smoker Maintenance:  none Pt of: Dr. Ander Slade  11/16/2018  - Visit   33 year old male never smoker presenting to our office today for hospital follow-up.  Patient was admitted for acute on chronic respiratory failure with hypercarbia.  Patient was diuresed as well as discharged home on a trilogy ventilator for management of hypercarbia.  Patient was previously managed by [facility] pulmonary group where he was encouraged to get a sleep study but he did not complete that.  Patient needs sleep study now that he is outpatient.  Patient continues to be maintained on 2 L nasal cannula 24/7 for management of his oxygen needs.  Patient reports he is taken off his oxygen at home and his SPO2 is 84%.  Patient was never referred to cardiology at discharge as initially mentioned in his consult note.  Patient did complete PCP follow-up on 11/15/2018.  They did baseline blood work for be met his CO2 is improved to 35 per care everywhere.   Tests:   11/15/2018-Bmet-CO2 35 per care everywhere  11/11/2018- Bmet - CO2 in the hospital 42  CTA Chest 1/8 >> neg for PE, peribronchial thickening, no consolidation, 30m RUL pulmonary nodule  RUQ [place]1/8 >> cholelithiasis, no acute cholecystitis, no biliary ductal dilation, small R effusion  CXR 11/09/2018>>Stable generalized cardiomegaly. No edema or consolidation.   11/08/2018-echocardiogram-LV ejection  fraction 55 to 60%, no real apical images poor study  11/09/2018-chest x-ray-stable generalized cardiomegaly no edema or consolidation  11/09/2018-respiratory virus panel - negative  11/09/2018-Epworth score-16  FENO:  No results found for: NITRICOXIDE  PFT: No flowsheet data found.  Imaging: Dg Chest 2 View  Result Date: 11/07/2018 CLINICAL DATA:  Shortness of breath and chest pain EXAM: CHEST - 2 VIEW COMPARISON:  October 14, 2016 FINDINGS: Degree of inspiration is shallow. There is bibasilar atelectasis. There is no edema or consolidation. There is cardiomegaly with pulmonary vascularity normal. No adenopathy. No bone lesions. IMPRESSION: Bibasilar atelectasis. No frank edema or consolidation. There is cardiomegaly. Electronically Signed   By: WLowella GripIII M.D.   On: 11/07/2018 14:33   Ct Angio Chest Pe W/cm &/or Wo Cm  Result Date: 11/07/2018 CLINICAL DATA:  33y/o M; shortness of breath, cough, tachycardia, chest pain. EXAM: CT ANGIOGRAPHY CHEST WITH CONTRAST TECHNIQUE: Multidetector CT imaging of the chest was performed using the standard protocol during bolus administration of intravenous contrast. Multiplanar CT image reconstructions and MIPs were obtained to evaluate the vascular anatomy. CONTRAST:  1084mISOVUE-370 IOPAMIDOL (ISOVUE-370) INJECTION 76% COMPARISON:  03/18/2016 CT chest. FINDINGS: Cardiovascular: Stable cardiomegaly. No pericardial effusion. Normal caliber thoracic aorta and main pulmonary artery. Satisfactory opacification of the pulmonary arteries mild respiratory motion artifact at the lung bases. Mediastinum/Nodes: No enlarged mediastinal, hilar, or axillary lymph nodes. Thyroid gland, trachea, and esophagus demonstrate no significant findings. Lungs/Pleura: 5 mm right upper lobe pulmonary nodule (series 8, image 102). Peribronchial thickening and minor atelectasis in the lung bases.  No consolidation, effusion, or pneumothorax. Upper Abdomen: No acute  abnormality. Musculoskeletal: No chest wall abnormality. No acute or significant osseous findings. Review of the MIP images confirms the above findings. IMPRESSION: 1. Mild respiratory motion artifact at the lung bases. No pulmonary embolus identified. 2. Peribronchial thickening in the lung bases may reflect vascular congestion, bronchitis, or reactive airway disease. No consolidation. 3. 5 mm right upper lobe pulmonary nodule. No follow-up needed if patient is low-risk. Non-contrast chest CT can be considered in 12 months if patient is high-risk. This recommendation follows the consensus statement: Guidelines for Management of Incidental Pulmonary Nodules Detected on CT Images: From the Fleischner Society 2017; Radiology 2017; 284:228-243. 4. Stable cardiomegaly. Electronically Signed   By: Kristine Garbe M.D.   On: 11/07/2018 17:06   Dg Chest Port 1 View  Result Date: 11/09/2018 CLINICAL DATA:  Decreased oxygen saturation EXAM: PORTABLE CHEST 1 VIEW COMPARISON:  Chest radiograph and chest CT November 07, 2018 FINDINGS: There is generalized cardiomegaly. There is no appreciable edema or consolidation. No evident adenopathy. No bone lesions. IMPRESSION: Stable generalized cardiomegaly.  No edema or consolidation. Electronically Signed   By: Lowella Grip III M.D.   On: 11/09/2018 15:26   US Abdomen Limited Ruq  Result Date: 11/07/2018 CLINICAL DATA:  Elevated liver function tests. Chest pain. Dyspnea. EXAM: ULTRASOUND ABDOMEN LIMITED RIGHT UPPER QUADRANT COMPARISON:  None. FINDINGS: Gallbladder: Multiple layering shadowing gallstones within the nondistended gallbladder. No definite gallbladder wall thickening. No pericholecystic fluid. No sonographic Murphy sign. Common bile duct: Diameter: 5 mm Liver: No focal lesion identified. Within normal limits in parenchymal echogenicity. Portal vein is patent on color Doppler imaging with normal direction of blood flow towards the liver. Small right  pleural effusion noted. IMPRESSION: 1. Cholelithiasis.  No evidence of acute cholecystitis. 2. No biliary ductal dilatation. 3. Normal liver. 4. Small right pleural effusion incidentally noted. Electronically Signed   By: Ilona Sorrel M.D.   On: 11/07/2018 20:30      Specialty Problems      Pulmonary Problems   Acute bronchitis   Acute respiratory failure with hypoxia (HCC)   Pulmonary nodule   Chronic respiratory failure with hypoxia and hypercapnia (HCC)      No Known Allergies  Immunization History  Administered Date(s) Administered  . Influenza,inj,Quad PF,6+ Mos 11/16/2018  . Pneumococcal Polysaccharide-23 11/16/2018    Flu vaccine today  Pneumovax23 today   Past Medical History:  Diagnosis Date  . Acute respiratory failure with hypoxia ([place])   . CHF (congestive heart failure) ([place])   . COPD (chronic obstructive pulmonary disease) ([place])   . Diabetes mellitus without complication ([place])   . DVT (deep venous thrombosis) ([place])   . Hypertension   . Pulmonary hypertension (HCC)     Tobacco History: Social History   Tobacco Use  Smoking Status Never Smoker  Smokeless Tobacco Never Used   Counseling given: Yes  Continue not smoking  Outpatient Encounter Medications as of 11/16/2018  Medication Sig  . dextromethorphan-guaiFENesin (MUCINEX DM) 30-600 MG 12hr tablet Take 2 tablets by mouth 2 (two) times daily as needed for cough (and mucous).  . furosemide (LASIX) 40 MG tablet Take 40 mg by mouth daily.   . metFORMIN (GLUCOPHAGE) 500 MG tablet Take 500 mg by mouth daily.  . metoprolol tartrate (LOPRESSOR) 100 MG tablet Take 100 mg by mouth 2 (two) times daily.   No facility-administered encounter medications on file as of 11/16/2018.     Review of Systems  Review  of Systems  Constitutional: Positive for fatigue. Negative for chills and fever.  HENT: Negative for congestion, sinus pressure and sinus pain.   Respiratory: Negative for cough, shortness of breath and  wheezing.   Cardiovascular: Positive for leg swelling (wearing compression stockings today ). Negative for chest pain and palpitations.       +occasional orthopnea   Gastrointestinal: Negative for diarrhea, nausea and vomiting.  Psychiatric/Behavioral: Negative for sleep disturbance.     Physical Exam  BP 128/84 (BP Location: Right Arm, Cuff Size: Large)   Pulse 100   Ht 5' 7.5" (1.715 m)   Wt (!) 330 lb 3.2 oz (149.8 kg)   SpO2 95%   BMI 50.95 kg/m   Wt Readings from Last 5 Encounters:  11/16/18 (!) 330 lb 3.2 oz (149.8 kg)  11/11/18 (!) 331 lb 8 oz (150.4 kg)  10/14/16 (!) 345 lb (156.5 kg)  03/18/16 (!) 343 lb 12.8 oz (155.9 kg)  03/30/15 (!) 317 lb (143.8 kg)   2L via nasal cannula continuous  Physical Exam  Constitutional: He is oriented to person, place, and time and well-developed, well-nourished, and in no distress. Vital signs are normal. He does not have a sickly appearance. No distress.  + Morbidly obese adult male  HENT:  Head: Normocephalic and atraumatic.  Right Ear: Hearing, tympanic membrane, external ear and ear canal normal.  Left Ear: Hearing, tympanic membrane, external ear and ear canal normal.  Nose: Mucosal edema present. Right sinus exhibits no maxillary sinus tenderness and no frontal sinus tenderness. Left sinus exhibits no maxillary sinus tenderness and no frontal sinus tenderness.  Mouth/Throat: Uvula is midline and oropharynx is clear and moist. No oropharyngeal exudate.  Mallampati 3  Eyes: Pupils are equal, round, and reactive to light.  Neck: Normal range of motion. Neck supple.  Cardiovascular: Normal rate, regular rhythm and normal heart sounds.  + Distant heart sounds  Pulmonary/Chest: Effort normal and breath sounds normal. No accessory muscle usage. No respiratory distress. He has no decreased breath sounds. He has no wheezes. He has no rhonchi. He has no rales.  + Distant breath sounds  Abdominal: Soft. Bowel sounds are normal. There is  no abdominal tenderness.  Musculoskeletal: Normal range of motion.        General: Edema (Compression stockings applied bilaterally appears to be about 1+ bilaterally lower extremity swelling with compression stockings) present.  Lymphadenopathy:    He has no cervical adenopathy.  Neurological: He is alert and oriented to person, place, and time. Gait normal.  Skin: Skin is warm and dry. He is not diaphoretic. No erythema.  Psychiatric: Mood, memory, affect and judgment normal.  Nursing note and vitals reviewed.     Lab Results:  CBC    Component Value Date/Time   WBC 10.9 (H) 11/09/2018 0434   RBC 6.30 (H) 11/09/2018 0434   HGB 18.7 (H) 11/09/2018 0434   HCT 64.4 (H) 11/09/2018 0434   PLT 158 11/09/2018 0434   MCV 102.2 (H) 11/09/2018 0434   MCH 29.7 11/09/2018 0434   MCHC 29.0 (L) 11/09/2018 0434   RDW 16.7 (H) 11/09/2018 0434   LYMPHSABS 0.8 11/09/2018 0434   MONOABS 0.6 11/09/2018 0434   EOSABS 0.0 11/09/2018 0434   BASOSABS 0.0 11/09/2018 0434    BMET    Component Value Date/Time   NA 141 11/11/2018 0343   K 4.0 11/11/2018 0343   CL 90 (L) 11/11/2018 0343   CO2 42 (H) 11/11/2018 0343   GLUCOSE 101 (H)  11/11/2018 0343   BUN 22 (H) 11/11/2018 0343   CREATININE 1.08 11/11/2018 0343   CALCIUM 9.1 11/11/2018 0343   GFRNONAA >60 11/11/2018 0343   GFRAA >60 11/11/2018 0343    BNP    Component Value Date/Time   BNP 162.6 (H) 11/07/2018 1246    ProBNP No results found for: PROBNP    Assessment & Plan:    Suspected sleep apnea Assessment: Obesity hypoventilation syndrome and recent admission for respiratory failure with hypercarbia BMI 50.95 Epworth score in the hospital was 14 Mallampati 3 Currently managed on home trilogy ventilator at this time  Plan: Split-night sleep study ordered Establish with Dr. Ander Slade for sleep and pulmonary follow-up  Chronic respiratory failure with hypoxia and hypercapnia (HCC) Assessment: BMI 50.95 Hypercarbia and  last hospitalization Managed on BiPAP and discharged home on trilogy ventilator Last CO2 on 11/15/2018 is 35 and has decreased from 42 at discharge from hospital Managed on 2 L via nasal cannula 24/7  Plan: Continue oxygen as prescribed Walk in office today for POC >>> Qualify for 3 L pulsed on walk today Maintain trilogy ventilator at this time We will order split-night sleep study to further evaluate for obstructive sleep apnea and obesity hypoventilation syndrome Hold off on lab work or ABG as CO2 is decreasing and patient is asymptomatic Establish with Dr. Ander Slade for sleep and pulmonary management Flu vaccine today Pneumovax 23 today   Acute on chronic diastolic heart failure ([place]) Assessment: Acute on chronic diastolic heart failure at last hospitalization Occasional orthopnea patient reports No cardiology provider at this time  Plan:  Referral to Dr. Einar Gip Continue Lasix 40 mg daily  Morbid (severe) obesity due to excess calories ([place]) Assessment: BMI 50.95 Suspected obesity hypoventilation syndrome  Plan: Referral to medical weight management Flu vaccine today  Edema Plan: Continue compression stockings daily Continue Lasix as prescribed Referral to Dr. Einar Gip for further management     Lauraine Rinne, NP 11/16/2018   This appointment was 42 min long with over 50% of the time in direct face-to-face patient care, assessment, plan of care, and follow-up.

## 2018-11-16 NOTE — Assessment & Plan Note (Signed)
Plan: Continue compression stockings daily Continue Lasix as prescribed Referral to Dr. Jacinto Halim for further management

## 2018-11-16 NOTE — Assessment & Plan Note (Addendum)
Assessment: BMI 50.95 Suspected obesity hypoventilation syndrome  Plan: Referral to medical weight management Flu vaccine today

## 2018-11-16 NOTE — Assessment & Plan Note (Signed)
Assessment: Obesity hypoventilation syndrome and recent admission for respiratory failure with hypercarbia BMI 50.95 Epworth score in the hospital was 14 Mallampati 3 Currently managed on home trilogy ventilator at this time  Plan: Split-night sleep study ordered Establish with Dr. Wynona Neat for sleep and pulmonary follow-up

## 2018-11-16 NOTE — Patient Instructions (Addendum)
We have ordered a split-night sleep study to evaluate you for sleep apnea >>>Please proceed to the patient care coordinators to get scheduled for this  Schedule a follow-up office visit with Dr. Wynona Neatlalere to establish for pulmonary and sleep needs >>>This will be a 30-minute visit  We will contact your primary care office to get recent lab work to check your CO2 levels  Continue trilogy ventilator use at home for right now >>>[facility] ventilator settings  Continue oxygen therapy as prescribed -2 L via nasal cannula 24/7 >>>maintain oxygen saturations greater than 88 percent  >>>if unable to maintain oxygen saturations please contact the office  >>>do not smoke with oxygen  >>>can use nasal saline gel or nasal saline rinses to moisturize nose if oxygen causes dryness  Walk for POC today  Disability information needs to go to: [facility] [address] [place] [id number] Telephone [phone]   Flu vaccine today Pneumovax 23 today  Referral to cardiology Dr. Jacinto HalimGanji Referral to healthy weight and wellness  Patient Education for fluid management  Do the following things EVERY DAY:   1. Weigh yourself EVERY morning after you go to the bathroom but before you eat or drink anything. Write this number down in a weight log/diary.    2. Take your medicines as prescribed. If you have concerns about your medications, please call us before you stop taking them.    3. Eat low salt foods-Limit salt (sodium) to 2000 mg per day. This will help prevent your body from holding onto fluid. Read food labels as many processed foods have a lot of sodium, especially canned goods and prepackaged meats. If you would like some assistance choosing low sodium foods, we would be happy to set you up with a nutritionist.   4. Stay as active as you can everyday. Staying active will give you more energy and make your muscles  stronger. Start with 5 minutes at a time and work your way up to 30 minutes a day. Break up your activities--do some in the morning and some in the afternoon. Start with 3 days per week and work your way up to 5 days as you can.  If you have chest pain, feel short of breath, dizzy, or lightheaded, STOP. If you don't feel better after a short rest, call 911. If you do feel better, call the office to let us know you have symptoms with exercise.   5. Limit all fluids for the day to less than 2 liters. Fluid includes all drinks, coffee, juice, ice chips, soup, jello, and all other liquids.    It is flu season:   >>>Remember to be washing your hands regularly, using hand sanitizer, be careful to use around herself with has contact with people who are sick will increase her chances of getting sick yourself. >>> Best ways to protect herself from the flu: Receive the yearly flu vaccine, practice good hand hygiene washing with soap and also using hand sanitizer when available, eat a nutritious meals, get adequate rest, hydrate appropriately   Please contact the office if your symptoms worsen or you have concerns that you are not improving.   Thank you for choosing [facility] for your healthcare, and for allowing us to partner with you on your healthcare journey. I am thankful to be able to provide care to you today.   Elisha HeadlandBrian Deiontae Rabel FNP-C     Pneumococcal Vaccine, Polyvalent  solution for injection What is this medicine? PNEUMOCOCCAL VACCINE, POLYVALENT (NEU mo KOK al vak SEEN, pol ee VEY luhnt) is a vaccine to prevent pneumococcus bacteria infection. These bacteria are a major cause of ear infections, Strep throat infections, and serious pneumonia, meningitis, or blood infections worldwide. These vaccines help the body to produce antibodies (protective substances) that help your body defend against these bacteria. This vaccine is recommended for people 16 years of age and older with health  problems. It is also recommended for all adults over 15 years old. This vaccine will not treat an infection. This medicine may be used for other purposes; ask your health care provider or pharmacist if you have questions. COMMON BRAND NAME(S): Pneumovax 23 What should I tell my health care provider before I take this medicine? They need to know if you have any of these conditions: -bleeding problems -bone marrow or organ transplant -cancer, Hodgkin's disease -fever -infection -immune system problems -low platelet count in the blood -seizures -an unusual or allergic reaction to pneumococcal vaccine, diphtheria toxoid, other vaccines, latex, other medicines, foods, dyes, or preservatives -pregnant or trying to get pregnant -breast-feeding How should I use this medicine? This vaccine is for injection into a muscle or under the skin. It is given by a health care professional. A copy of Vaccine Information Statements will be given before each vaccination. Read this sheet carefully each time. The sheet may change frequently. Talk to your pediatrician regarding the use of this medicine in children. While this drug may be prescribed for children as young as 78 years of age for selected conditions, precautions do apply. Overdosage: If you think you have taken too much of this medicine contact a poison control center or emergency room at once. NOTE: This medicine is only for you. Do not share this medicine with others. What if I miss a dose? It is important not to miss your dose. Call your doctor or health care professional if you are unable to keep an appointment. What may interact with this medicine? -medicines for cancer chemotherapy -medicines that suppress your immune function -medicines that treat or prevent blood clots like warfarin, enoxaparin, and dalteparin -steroid medicines like prednisone or cortisone This list may not describe all possible interactions. Give your health care provider  a list of all the medicines, herbs, non-prescription drugs, or dietary supplements you use. Also tell them if you smoke, drink alcohol, or use illegal drugs. Some items may interact with your medicine. What should I watch for while using this medicine? Mild fever and pain should go away in 3 days or less. Report any unusual symptoms to your doctor or health care professional. What side effects may I notice from receiving this medicine? Side effects that you should report to your doctor or health care professional as soon as possible: -allergic reactions like skin rash, itching or hives, swelling of the face, lips, or tongue -breathing problems -confused -fever over 102 degrees F -pain, tingling, numbness in the hands or feet -seizures -unusual bleeding or bruising -unusual muscle weakness Side effects that usually do not require medical attention (report to your doctor or health care professional if they continue or are bothersome): -aches and pains -diarrhea -fever of 102 degrees F or less -headache -irritable -loss of appetite -pain, tender at site where injected -trouble sleeping This list may not describe all possible side effects. Call your doctor for medical advice about side effects. You may report side effects to FDA at 1-800-FDA-1088. Where should I keep  my medicine? This does not apply. This vaccine is given in a clinic, pharmacy, doctor's office, or other health care setting and will not be stored at home. NOTE: This sheet is a summary. It may not cover all possible information. If you have questions about this medicine, talk to your doctor, pharmacist, or health care provider.  2019 Elsevier/Gold Standard (2008-05-23 14:32:37) Influenza Virus Vaccine injection What is this medicine? INFLUENZA VIRUS VACCINE (in floo EN zuh VAHY ruhs vak SEEN) helps to reduce the risk of getting influenza also known as the flu. The vaccine only helps protect you against some strains of the  flu. This medicine may be used for other purposes; ask your health care provider or pharmacist if you have questions. COMMON BRAND NAME(S): Afluria, Agriflu, Alfuria, FLUAD, Fluarix, Fluarix Quadrivalent, Flublok, Flublok Quadrivalent, FLUCELVAX, Flulaval, Fluvirin, Fluzone, Fluzone High-Dose, Fluzone Intradermal What should I tell my health care provider before I take this medicine? They need to know if you have any of these conditions: -bleeding disorder like hemophilia -fever or infection -Guillain-Barre syndrome or other neurological problems -immune system problems -infection with the human immunodeficiency virus (HIV) or AIDS -low blood platelet counts -multiple sclerosis -an unusual or allergic reaction to influenza virus vaccine, latex, other medicines, foods, dyes, or preservatives. Different brands of vaccines contain different allergens. Some may contain latex or eggs. Talk to your doctor about your allergies to make sure that you get the right vaccine. -pregnant or trying to get pregnant -breast-feeding How should I use this medicine? This vaccine is for injection into a muscle or under the skin. It is given by a health care professional. A copy of Vaccine Information Statements will be given before each vaccination. Read this sheet carefully each time. The sheet may change frequently. Talk to your healthcare provider to see which vaccines are right for you. Some vaccines should not be used in all age groups. Overdosage: If you think you have taken too much of this medicine contact a poison control center or emergency room at once. NOTE: This medicine is only for you. Do not share this medicine with others. What if I miss a dose? This does not apply. What may interact with this medicine? -chemotherapy or radiation therapy -medicines that lower your immune system like etanercept, anakinra, infliximab, and adalimumab -medicines that treat or prevent blood clots like  warfarin -phenytoin -steroid medicines like prednisone or cortisone -theophylline -vaccines This list may not describe all possible interactions. Give your health care provider a list of all the medicines, herbs, non-prescription drugs, or dietary supplements you use. Also tell them if you smoke, drink alcohol, or use illegal drugs. Some items may interact with your medicine. What should I watch for while using this medicine? Report any side effects that do not go away within 3 days to your doctor or health care professional. Call your health care provider if any unusual symptoms occur within 6 weeks of receiving this vaccine. You may still catch the flu, but the illness is not usually as bad. You cannot get the flu from the vaccine. The vaccine will not protect against colds or other illnesses that may cause fever. The vaccine is needed every year. What side effects may I notice from receiving this medicine? Side effects that you should report to your doctor or health care professional as soon as possible: -allergic reactions like skin rash, itching or hives, swelling of the face, lips, or tongue Side effects that usually do not require medical attention (report to  your doctor or health care professional if they continue or are bothersome): -fever -headache -muscle aches and pains -pain, tenderness, redness, or swelling at the injection site -tiredness This list may not describe all possible side effects. Call your doctor for medical advice about side effects. You may report side effects to FDA at 1-800-FDA-1088. Where should I keep my medicine? The vaccine will be given by a health care professional in a clinic, pharmacy, doctor's office, or other health care setting. You will not be given vaccine doses to store at home. NOTE: This sheet is a summary. It may not cover all possible information. If you have questions about this medicine, talk to your doctor, pharmacist, or health care  provider.  2019 Elsevier/Gold Standard (2015-05-08 10:07:28)

## 2018-11-16 NOTE — Assessment & Plan Note (Signed)
Assessment: Acute on chronic diastolic heart failure at last hospitalization Occasional orthopnea patient reports No cardiology provider at this time  Plan:  Referral to Dr. Jacinto Halim Continue Lasix 40 mg daily

## 2018-11-20 ENCOUNTER — Telehealth: Payer: Self-pay | Admitting: Pulmonary Disease

## 2018-11-20 NOTE — Telephone Encounter (Signed)
Edward Castro, I see you were working on this referral. Can you please follow up with this?

## 2018-11-20 NOTE — Telephone Encounter (Signed)
I have faxed these records over since I do not have access to load them in proficient yet

## 2018-11-28 ENCOUNTER — Telehealth: Payer: Self-pay | Admitting: Pulmonary Disease

## 2018-11-29 NOTE — Telephone Encounter (Signed)
Paperwork filled out by Elisha Headland and returned to [facility].  Nothing further needed at this time.

## 2018-11-29 NOTE — Telephone Encounter (Signed)
Rec'd completed paperwork - Fwd to [facility] via interoffice mail -pr  °

## 2018-12-03 ENCOUNTER — Ambulatory Visit (INDEPENDENT_AMBULATORY_CARE_PROVIDER_SITE_OTHER): Payer: BLUE CROSS/BLUE SHIELD | Admitting: Pulmonary Disease

## 2018-12-03 ENCOUNTER — Encounter: Payer: Self-pay | Admitting: Pulmonary Disease

## 2018-12-03 VITALS — BP 138/76 | HR 108 | Ht 67.0 in | Wt 329.0 lb

## 2018-12-03 DIAGNOSIS — J9612 Chronic respiratory failure with hypercapnia: Secondary | ICD-10-CM

## 2018-12-03 DIAGNOSIS — R911 Solitary pulmonary nodule: Secondary | ICD-10-CM | POA: Diagnosis not present

## 2018-12-03 DIAGNOSIS — J9601 Acute respiratory failure with hypoxia: Secondary | ICD-10-CM

## 2018-12-03 DIAGNOSIS — J9611 Chronic respiratory failure with hypoxia: Secondary | ICD-10-CM | POA: Diagnosis not present

## 2018-12-03 DIAGNOSIS — R29818 Other symptoms and signs involving the nervous system: Secondary | ICD-10-CM

## 2018-12-03 NOTE — Progress Notes (Signed)
Edward Castro    [id number]    03-21-1986  Primary Care Physician:Furr, Tor Netters., MD  Referring Physician: Laqueta Due., MD 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 dextromethorphan-guaiFENesin (MUCINEX DM) 30-600 MG 12hr tablet Take 2 tablets by mouth 2 (two) times daily as needed for cough (and mucous).  . furosemide (LASIX) 40 MG tablet Take 40 mg by mouth daily.   Marland Kitchen lisinopril (PRINIVIL,ZESTRIL) 30 MG tablet   . metFORMIN (GLUCOPHAGE) 500 MG tablet Take 500 mg by mouth daily.  . metoprolol tartrate (LOPRESSOR) 100 MG tablet Take 100 mg by mouth 2 (two) times daily.   No facility-administered encounter medications on file as of 12/03/2018.     Allergies as of 12/03/2018  . (No Known Allergies)    Past Medical History:  Diagnosis Date  . Acute respiratory failure with hypoxia (HCC)   . CHF (congestive heart failure) (HCC)   . COPD (chronic obstructive pulmonary disease) (HCC)   . Diabetes mellitus without complication (HCC)   . DVT (deep venous thrombosis) (HCC)   . Hypertension   . Pulmonary hypertension (HCC)     Past Surgical History:  Procedure Laterality Date  . CATARACT EXTRACTION    . EYE SURGERY    . RETINAL DETACHMENT SURGERY      No family history on file.  Social History   Socioeconomic  History  . Marital status: Single    Spouse name: Not on file  . Number of children: Not on file  . Years of education: Not on file  . Highest education level: Not on file  Occupational History  . Not on file  Social Needs  . Financial resource strain: Not on file  . Food insecurity:    Worry: Not on file    Inability: Not on file  . Transportation needs:    Medical: Not on file    Non-medical: Not on file  Tobacco Use  . Smoking status: Never Smoker  . Smokeless tobacco: Never Used  Substance and Sexual Activity  . Alcohol use: No  . Drug use: No  . Sexual activity: Not on file  Lifestyle  . Physical activity:    Days per week: Not on file    Minutes per session: Not on file  . Stress: Not on file  Relationships  . Social connections:    Talks on phone: Not on file    Gets together: Not on file    Attends religious service: Not on file    Active member of club or organization: Not on file    Attends meetings of clubs or organizations: Not on file    Relationship status: Not on file  . Intimate partner violence:    Fear of current or ex partner: Not on file    Emotionally abused: Not on file  Physically abused: Not on file    Forced sexual activity: Not on file  Other Topics Concern  . Not on file  Social History Narrative  . Not on file    Review of Systems  Constitutional: Negative for fatigue and fever.  HENT: Negative.   Eyes: Negative.   Respiratory: Positive for shortness of breath.   Cardiovascular: Negative.   Gastrointestinal: Negative.   Endocrine: Negative.   Genitourinary: Negative.   Musculoskeletal: Negative.  Negative for arthralgias and joint swelling.    Vitals:   12/03/18 1107  BP: 138/76  Pulse: (!) 108  SpO2: 94%     Physical Exam  Constitutional: He is oriented to person, place, and time. He appears well-developed and well-nourished.  Obese  HENT:  Head: Normocephalic and atraumatic.  Eyes: Pupils are equal, round, and  reactive to light. Conjunctivae and EOM are normal. Right eye exhibits no discharge. Left eye exhibits no discharge.  Neck: Normal range of motion. Neck supple. No tracheal deviation present. No thyromegaly present.  Cardiovascular: Normal rate and regular rhythm.  Pulmonary/Chest: Effort normal and breath sounds normal. No respiratory distress. He has no wheezes. He has no rales. He exhibits no tenderness.  Abdominal: Soft. Bowel sounds are normal. He exhibits no distension.  Neurological: He is alert and oriented to person, place, and time. No cranial nerve deficit.  Psychiatric: He has a normal mood and affect.   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

## 2018-12-03 NOTE — Patient Instructions (Signed)
We will see back in the office about 8 weeks Continue weight loss efforts Continue using your machine-we will contact the medical supply company to get a download from it to see how it is working   Sleep Apnea Sleep apnea is a condition in which breathing pauses or becomes shallow during sleep. Episodes of sleep apnea usually last 10 seconds or longer, and they may occur as many as 20 times an hour. Sleep apnea disrupts your sleep and keeps your body from getting the rest that it needs. This condition can increase your risk of certain health problems, including:  Heart attack.  Stroke.  Obesity.  Diabetes.  Heart failure.  Irregular heartbeat. There are three kinds of sleep apnea:  Obstructive sleep apnea. This kind is caused by a blocked or collapsed airway.  Central sleep apnea. This kind happens when the part of the brain that controls breathing does not send the correct signals to the muscles that control breathing.  Mixed sleep apnea. This is a combination of obstructive and central sleep apnea. What are the causes? The most common cause of this condition is a collapsed or blocked airway. An airway can collapse or become blocked if:  Your throat muscles are abnormally relaxed.  Your tongue and tonsils are larger than normal.  You are overweight.  Your airway is smaller than normal. What increases the risk? This condition is more likely to develop in people who:  Are overweight.  Smoke.  Have a smaller than normal airway.  Are elderly.  Are male.  Drink alcohol.  Take sedatives or tranquilizers.  Have a family history of sleep apnea. What are the signs or symptoms? Symptoms of this condition include:  Trouble staying asleep.  Daytime sleepiness and tiredness.  Irritability.  Loud snoring.  Morning headaches.  Trouble concentrating.  Forgetfulness.  Decreased interest in sex.  Unexplained sleepiness.  Mood swings.  Personality  changes.  Feelings of depression.  Waking up often during the night to urinate.  Dry mouth.  Sore throat. How is this diagnosed? This condition may be diagnosed with:  A medical history.  A physical exam.  A series of tests that are done while you are sleeping (sleep study). These tests are usually done in a sleep lab, but they may also be done at home. How is this treated? Treatment for this condition aims to restore normal breathing and to ease symptoms during sleep. It may involve managing health issues that can affect breathing, such as high blood pressure or obesity. Treatment may include:  Sleeping on your side.  Using a decongestant if you have nasal congestion.  Avoiding the use of depressants, including alcohol, sedatives, and narcotics.  Losing weight if you are overweight.  Making changes to your diet.  Quitting smoking.  Using a device to open your airway while you sleep, such as: ? An oral appliance. This is a custom-made mouthpiece that shifts your lower jaw forward. ? A continuous positive airway pressure (CPAP) device. This device delivers oxygen to your airway through a mask. ? A nasal expiratory positive airway pressure (EPAP) device. This device has valves that you put into each nostril. ? A bi-level positive airway pressure (BPAP) device. This device delivers oxygen to your airway through a mask.  Surgery if other treatments do not work. During surgery, excess tissue is removed to create a wider airway. It is important to get treatment for sleep apnea. Without treatment, this condition can lead to:  High blood pressure.  Coronary  artery disease.  (Men) An inability to achieve or maintain an erection (impotence).  Reduced thinking abilities. Follow these instructions at home:  Make any lifestyle changes that your health care provider recommends.  Eat a healthy, well-balanced diet.  Take over-the-counter and prescription medicines only as told  by your health care provider.  Avoid using depressants, including alcohol, sedatives, and narcotics.  Take steps to lose weight if you are overweight.  If you were given a device to open your airway while you sleep, use it only as told by your health care provider.  Do not use any tobacco products, such as cigarettes, chewing tobacco, and e-cigarettes. If you need help quitting, ask your health care provider.  Keep all follow-up visits as told by your health care provider. This is important. Contact a health care provider if:  The device that you received to open your airway during sleep is uncomfortable or does not seem to be working.  Your symptoms do not improve.  Your symptoms get worse. Get help right away if:  You develop chest pain.  You develop shortness of breath.  You develop discomfort in your back, arms, or stomach.  You have trouble speaking.  You have weakness on one side of your body.  You have drooping in your face. These symptoms may represent a serious problem that is an emergency. Do not wait to see if the symptoms will go away. Get medical help right away. Call your local emergency services (911 in the [place] Do not drive yourself to the hospital. This information is not intended to replace advice given to you by your health care provider. Make sure you discuss any questions you have with your health care provider. Document Released: 10/07/2002 Document Revised: 05/15/2017 Document Reviewed: 07/27/2015 Elsevier Interactive Patient Education  2019 [employer]

## 2018-12-21 ENCOUNTER — Ambulatory Visit: Payer: Self-pay | Admitting: Cardiology

## 2018-12-25 ENCOUNTER — Ambulatory Visit (HOSPITAL_BASED_OUTPATIENT_CLINIC_OR_DEPARTMENT_OTHER): Payer: BLUE CROSS/BLUE SHIELD | Attending: Pulmonary Disease | Admitting: Pulmonary Disease

## 2018-12-25 VITALS — Wt 329.0 lb

## 2018-12-25 DIAGNOSIS — G4730 Sleep apnea, unspecified: Secondary | ICD-10-CM

## 2018-12-25 DIAGNOSIS — G4733 Obstructive sleep apnea (adult) (pediatric): Secondary | ICD-10-CM | POA: Insufficient documentation

## 2018-12-25 DIAGNOSIS — R29818 Other symptoms and signs involving the nervous system: Secondary | ICD-10-CM

## 2018-12-25 DIAGNOSIS — R0902 Hypoxemia: Secondary | ICD-10-CM | POA: Insufficient documentation

## 2018-12-28 ENCOUNTER — Telehealth: Payer: Self-pay | Admitting: Pulmonary Disease

## 2018-12-28 NOTE — Telephone Encounter (Signed)
Sleep study result  Date of study: 12/25/2018  Recommendation:  CPAP therapy on 15 cm H2O with a Medium size Fisher[employer]Paykel Full Face Mask Simplus mask and heated humidification.  Follow-up in the office 1 to 3 months following set up of CPAP

## 2018-12-28 NOTE — Procedures (Signed)
POLYSOMNOGRAPHY  Last, First: Edward Castro, Edward Castro MRN: [id number] Gender: Male Age (years): 32 Weight (lbs): 329 DOB: 03-24-1986 BMI: 50 Primary Care: [facility] Epworth Score: 5 Referring: Coral Ceo NP Technician: Armen Pickup Interpreting: Tomma Lightning MD Study Type: Split Night CPAP Ordered Study Type: Split Night CPAP Study date: 12/25/2018 Location: [place] CLINICAL INFORMATION Edward Castro is a 33 year old Male and was referred to the sleep center for evaluation of G47.30 Sleep Apnea, Unspecified (780.57). Indications include COPD, OSA.  MEDICATIONS Patient self administered medications include: N/A. Medications administered during study include No sleep medicine administered.  SLEEP STUDY TECHNIQUE The patient underwent an attended overnight level one polysomnography titration to assess the effects of CPAP therapy. The following variables were monitored: EEG (C4-A1, C3-A2, O1-A2, O2-A1), EOG, submental and leg EMG, ECG, oxyhemoglobin saturation by pulse oximetry, thoracic and abdominal respiratory effort belts, nasal/oral airflow by pressure sensor, body position sensor and snoring sensor. CPAP pressure was titrated to eliminate apneas, hypopneas and oxygen desaturation. Hypopneas were scored per AASM definition IB (4% desaturation)  The NPSG portion of the study ended at 12:35:37 AM . The CPAP titration was initiated at 12:41:13 AM AM with the CPAP portion of the study ending at 5:00:16 AM.  TECHNICIAN COMMENTS Comments added by Technician: TWO RESTROOM VISTED. O2 initiated due to low sats. Comments added by Scorer: N/A SLEEP ARCHITECTURE The recording time for the entire night was 405.8 minutes. The diagnostic portion was initiated at 10:14:25 PM and terminated at 12:35:37 AM. The time in bed was 141.2 minutes. EEG confirmed total sleep time was 104.5 minutes yielding a sleep efficiency of 74.0%%. Sleep onset after lights out was 10.2 minutes with a REM latency of N/A  minutes. The patient spent 16.3%% of the night in stage N1 sleep, 60.3%% in stage N2 sleep, 23.4%% in stage N3 and 0% in REM. The Arousal Index was 43.1/hour.  The titration portion was initiated at 12:41:13 AM and terminated at 5:00:16 AM. The time in bed was 259.0 minutes. EEG confirmed total sleep time was 114.5 minutes yielding a sleep efficiency of 44.2%%. Sleep onset after CPAP initiation was 49.9 minutes with a REM latency of 166.5 minutes. The patient spent 2.6%% of the night in stage N1 sleep, 34.5%% in stage N2 sleep, 25.8%% in stage N3 and 37.1% in REM. The Arousal Index was 22.0/hour. RESPIRATORY PARAMETERS During the diagnostic portion, there were a total of 146 respiratory disturbances recorded; 36 apneas ( 36 obstructive, 0 mixed, 0 central), 104 hypopneas and 6 RERAs. The apnea/hypopnea index 80.4 was events/hour and the RDI was 83.8 events/hour. The central sleep apnea index was 0.0 events/hour. The REM AHI was N/A /h and NREM AHI was 80.4/h. The REM RDI was 0.0 /h and NREM RDI was 83.8 /h. The supine AHI was 80.4/h, and the non supine AHI was 0/h; supine during 100.0%% of sleep. The supine RDI was 83.8/h, and the non supine RDI was N/A/h. Respiratory disturbances were associated with oxygen desaturation down to a nadir of 51.0 % during sleep. The mean oxygen saturation during the study was 80.1%. The cumulative time under 88% oxygen saturation was 75.8 minutes.  During the titration portion, the apnea/hypopnea index (AHI) was 39.8 events/hour and the RDI was 43.0 events/hour. The central sleep apnea index was events/hour. The most appropriate setting of CPAP was IPAP/EPAP 15/15 cm H2O. At this setting, the sleep efficiency was 98% and the patient was supine for 100%. The AHI was 24.0 events per hour(with 0 central  events). Oxygen nadir was 72.0. LEG MOVEMENT DATA The periodic limb movement index was 0.0/hour with an associated arousal index of /hour. CARDIAC DATA The underlying cardiac  rhythm was most consistent with sinus rhythm. Mean heart rate was 94.9 during diagnostic portion and 98.8 during titration portion of study. Additional rhythm abnormalities include None.   IMPRESSIONS - Severe Obstructive Sleep apnea(OSA) Optimal pressure attained. - EKG showed no cardiac abnormalities. - No Significant Central Sleep Apnea (CSA) - Severe Oxygen Desaturation - No snoring was audible during this study. - EEG did not show alpha intrusion. - No significant periodic leg movements(PLMs) during sleep. No significant associated arousals. - Reduced sleep efficiency, normal primary sleep latency, no REM sleep latency and normal slow wave latency.   DIAGNOSIS - Obstructive Sleep Apnea (327.23 [G47.33 ICD-10]) - Nocturnal Hypoxemia (327.26 [G47.36 ICD-10])   RECOMMENDATIONS - Trial of CPAP therapy on 15 cm H2O with a Medium size Fisher&Paykel Full Face Mask Simplus mask and heated humidification. - Avoid alcohol, sedatives and other CNS depressants that may worsen sleep apnea and disrupt normal sleep architecture. - Sleep hygiene should be reviewed to assess factors that may improve sleep quality. - Weight management and regular exercise should be initiated or continued. - Return to Sleep Center for re-evaluation after 4 weeks of therapy  [Electronically signed] 12/28/2018 05:57 AM  Virl Diamond MD NPI: [id number]

## 2019-01-01 ENCOUNTER — Encounter: Payer: Self-pay | Admitting: Cardiology

## 2019-01-01 NOTE — Progress Notes (Signed)
Patient referred by Laqueta Due., MD for edema  Subjective:   Edward Castro, male    DOB: 11/21/1985, 33 y.o.   MRN: [id number]   Chief Complaint  Patient presents with  . Edema    HPI  33 year old male with morbid obesity hypertension, type 2 diabetes mellitus, history of DVT, polycythemia,? COPD, BMI 50, suspected obstructive sleep apnea and obesity hypoventilation syndrome, pending sleep study, referred for evaluation of cardiomegaly and leg edema.  Patient was recently hospitalized with shortness of breath thought to be combination of acute on chronic respiratory failure, and acute on chronic diastolic heart failure.  Patient recently underwent echocardiogram, which was extremely limited in its visualization due to patient's body habitus.  LVEF was normal.  Patient is here with his father today.  He lives with his girlfriend and her kids, and works at [employer].  His work involves heavy lifting.  He also walks twice a day, up to 3 months every day.  With this level of activity, he denies chest pain, shortness of breath, palpitations, leg edema, orthopnea, PND, TIA/syncope. Historically, he has had leg edema, but has improved recently.  He is on 2 antihypertensive medications.  Is also on metformin for diabetes.  He is trying to lose weight with diet and exercise changes.  He usually eats vegetables and lean meat and has strictly cut down his red meat intake.  However, he does not count calories.  He recently underwent sleep study, awaiting results.   Past Medical History:  Diagnosis Date  . Acute respiratory failure with hypoxia (HCC)   . CHF (congestive heart failure) (HCC)   . COPD (chronic obstructive pulmonary disease) (HCC)   . Diabetes mellitus without complication (HCC)   . DVT (deep venous thrombosis) (HCC)   . Hypertension   . Pulmonary hypertension (HCC)      Past Surgical History:  Procedure Laterality Date  . CATARACT EXTRACTION    . EYE SURGERY    .  RETINAL DETACHMENT SURGERY       Social History   Socioeconomic History  . Marital status: Single    Spouse name: Not on file  . Number of children: Not on file  . Years of education: Not on file  . Highest education level: Not on file  Occupational History  . Not on file  Social Needs  . Financial resource strain: Not on file  . Food insecurity:    Worry: Not on file    Inability: Not on file  . Transportation needs:    Medical: Not on file    Non-medical: Not on file  Tobacco Use  . Smoking status: Never Smoker  . Smokeless tobacco: Never Used  Substance and Sexual Activity  . Alcohol use: No  . Drug use: No  . Sexual activity: Not on file  Lifestyle  . Physical activity:    Days per week: Not on file    Minutes per session: Not on file  . Stress: Not on file  Relationships  . Social connections:    Talks on phone: Not on file    Gets together: Not on file    Attends religious service: Not on file    Active member of club or organization: Not on file    Attends meetings of clubs or organizations: Not on file    Relationship status: Not on file  . Intimate partner violence:    Fear of current or ex partner: Not on file  Emotionally abused: Not on file    Physically abused: Not on file    Forced sexual activity: Not on file  Other Topics Concern  . Not on file  Social History Narrative  . Not on file     Current Outpatient Medications on File Prior to Visit  Medication Sig Dispense Refill  . dextromethorphan-guaiFENesin (MUCINEX DM) 30-600 MG 12hr tablet Take 2 tablets by mouth 2 (two) times daily as needed for cough (and mucous). 30 tablet 0  . furosemide (LASIX) 40 MG tablet Take 40 mg by mouth daily.     Marland Kitchen lisinopril (PRINIVIL,ZESTRIL) 30 MG tablet Take 30 mg by mouth daily.    . metFORMIN (GLUCOPHAGE) 500 MG tablet Take 500 mg by mouth daily.    . metoprolol tartrate (LOPRESSOR) 100 MG tablet Take 100 mg by mouth 2 (two) times daily.     No current  facility-administered medications on file prior to visit.     Cardiovascular studies:  EKG 01/02/2019:  Sinus rhythm 96 bpm.  Rightward axis.  Biatrial enlargement.  First-degree AV block.  Echocardiogram  11/08/2018: - Left ventricle: The cavity size was normal. Wall thickness was   increased in a pattern of mild LVH. Systolic function was normal.   The estimated ejection fraction was in the range of 55% to 60%.   The study is not technically sufficient to allow evaluation of LV   diastolic function. - Left atrium: The atrium was mildly dilated. - Right ventricle: Poorly visualized appears dilated - Impressions: No real apical images poor study.  Impressions:  - No real apical images poor study.  Review of Systems  Constitution: Negative for decreased appetite, malaise/fatigue, weight gain and weight loss.  HENT: Negative for congestion.   Eyes: Negative for visual disturbance.  Cardiovascular: Positive for dyspnea on exertion and leg swelling. Negative for chest pain, palpitations and syncope.  Respiratory: Positive for shortness of breath.   Endocrine: Negative for cold intolerance.  Hematologic/Lymphatic: Does not bruise/bleed easily.  Skin: Negative for itching and rash.  Musculoskeletal: Negative for myalgias.  Gastrointestinal: Negative for abdominal pain, nausea and vomiting.  Genitourinary: Negative for dysuria.  Neurological: Negative for dizziness and weakness.  Psychiatric/Behavioral: The patient is not nervous/anxious.   All other systems reviewed and are negative.        Vitals:   01/02/19 0941 01/02/19 0942  BP: (!) 137/100 (!) 163/119  Pulse:  (!) 104  SpO2:      Objective:   Physical Exam  Constitutional: He is oriented to person, place, and time. He appears well-developed and well-nourished. No distress.  Morbid obesity  HENT:  Head: Normocephalic and atraumatic.  Eyes: Pupils are equal, round, and reactive to light. Conjunctivae are normal.    Neck: No JVD present.  Cardiovascular: Normal rate, regular rhythm and intact distal pulses.  Pulmonary/Chest: Effort normal and breath sounds normal. He has no wheezes. He has no rales.  Abdominal: Soft. Bowel sounds are normal. There is no rebound.  Musculoskeletal:        General: Edema (Trace. Dark discoloration bot lower legs) present.  Lymphadenopathy:    He has no cervical adenopathy.  Neurological: He is alert and oriented to person, place, and time. No cranial nerve deficit.  Skin: Skin is warm and dry.  Psychiatric: He has a normal mood and affect.  Nursing note and vitals reviewed.         Assessment & Recommendations:    33 year old male with morbid obesity hypertension, type 2  diabetes mellitus, history of DVT, polycythemia,? COPD, BMI 50, suspected obstructive sleep apnea and obesity hypoventilation syndrome, pending sleep study, referred for evaluation of cardiomegaly and leg edema.  Leg edema: Likely related to venous insufficiency.  Patient is already started wearing compression stockings and has noticed improvement.  Encouraged regular use of compression stockings and keeping legs elevated at night.  Weight loss will certainly help with this.  Hypertension, bilateral unequal blood pressures: Difference was noted in both arm blood pressure on repeated checks.  I do not hear a bruit on exam.  However, recommend bilateral subclavian ultrasound to rule out subclavian stenosis.  I have not made any changes to his medications today.  Morbid obesity: I had a lengthy discussion with the patient regarding morbid obesity.  He is at risk of several health problems, including hypertension, diabetes, cardiovascular disease.  I think his venous insufficiency also is largely due to his obesity.  He has made changes to his diet and lifestyle and has cut down red meat, and eating more vegetables.  He has completely stopped intsake of sodas/sugary drinks. He walks up to 3 miles every  day.  However, he does not count his calories.  I have encouraged him to limit his daily intake to 1800 cal.  He is motivated and I am optimistic that he will lose weight with diet and lifestyle changes.  I will see him back after subclavian US to discuss results and recheck blood pressures.    Thank you for referring the patient to us. Please feel free to contact with any questions.  Elder NegusManish J Knight Oelkers, MD [facility] Pager: [phone] Office: [phone] If no answer Cell [phone]

## 2019-01-02 ENCOUNTER — Ambulatory Visit (INDEPENDENT_AMBULATORY_CARE_PROVIDER_SITE_OTHER): Payer: Self-pay | Admitting: Cardiology

## 2019-01-02 ENCOUNTER — Encounter: Payer: Self-pay | Admitting: Cardiology

## 2019-01-02 VITALS — BP 163/119 | HR 104 | Ht 68.0 in | Wt 331.0 lb

## 2019-01-02 DIAGNOSIS — R609 Edema, unspecified: Secondary | ICD-10-CM

## 2019-01-02 DIAGNOSIS — G4733 Obstructive sleep apnea (adult) (pediatric): Secondary | ICD-10-CM | POA: Insufficient documentation

## 2019-01-02 DIAGNOSIS — H2703 Aphakia, bilateral: Secondary | ICD-10-CM | POA: Insufficient documentation

## 2019-01-02 DIAGNOSIS — I5032 Chronic diastolic (congestive) heart failure: Secondary | ICD-10-CM

## 2019-01-02 DIAGNOSIS — H33021 Retinal detachment with multiple breaks, right eye: Secondary | ICD-10-CM | POA: Insufficient documentation

## 2019-01-02 DIAGNOSIS — J449 Chronic obstructive pulmonary disease, unspecified: Secondary | ICD-10-CM | POA: Insufficient documentation

## 2019-01-02 DIAGNOSIS — R0989 Other specified symptoms and signs involving the circulatory and respiratory systems: Secondary | ICD-10-CM

## 2019-01-02 DIAGNOSIS — I272 Pulmonary hypertension, unspecified: Secondary | ICD-10-CM | POA: Insufficient documentation

## 2019-01-02 DIAGNOSIS — Z8679 Personal history of other diseases of the circulatory system: Secondary | ICD-10-CM | POA: Insufficient documentation

## 2019-01-02 DIAGNOSIS — I10 Essential (primary) hypertension: Secondary | ICD-10-CM

## 2019-01-02 DIAGNOSIS — E119 Type 2 diabetes mellitus without complications: Secondary | ICD-10-CM

## 2019-01-02 NOTE — Patient Instructions (Signed)

## 2019-01-04 NOTE — Telephone Encounter (Signed)
lmom 

## 2019-01-10 NOTE — Telephone Encounter (Signed)
lmom 

## 2019-01-11 NOTE — Telephone Encounter (Signed)
lmom 

## 2019-01-21 ENCOUNTER — Other Ambulatory Visit: Payer: Self-pay

## 2019-01-21 ENCOUNTER — Ambulatory Visit: Payer: BLUE CROSS/BLUE SHIELD

## 2019-01-24 ENCOUNTER — Telehealth: Payer: Self-pay | Admitting: Pulmonary Disease

## 2019-01-24 NOTE — Telephone Encounter (Signed)
Pt requesting back to work note. Pt was seen on 12/03/18. He was to f/u on 3/31 but due to COVID his appt was cancelled. Can note be provided?

## 2019-01-24 NOTE — Telephone Encounter (Signed)
Not aware that we saw the patient with/for anything that precluded him from being able to work  From a respiratory perspective, as long as he is using his ventilator well He is able to work  One can be provided as long as it does not fill out the rules of essential workers being able to go back to work- We can involve one of our APP's to assist with this

## 2019-01-25 NOTE — Telephone Encounter (Signed)
LMTCB

## 2019-01-28 ENCOUNTER — Ambulatory Visit: Payer: BLUE CROSS/BLUE SHIELD | Admitting: Pulmonary Disease

## 2019-01-28 NOTE — Telephone Encounter (Signed)
LMTCB

## 2019-01-28 NOTE — Telephone Encounter (Signed)
Do have a compliance report on his CPAP?  If so can I have this printed and shown to me.  If he is compliant with using CPAP and not having issues then I do not see an issue with why he cannot go back to work.  Elisha Headland, FNP

## 2019-01-28 NOTE — Telephone Encounter (Signed)
Patient returning phone call.  Patient phone number is [phone].

## 2019-01-28 NOTE — Telephone Encounter (Signed)
Pt requesting a note back to work. Per Dr.AO patient can get assistance for APP. He has been seen by BM and disability paperwork was completed. Can clearance be be done over the phone (televisit)?

## 2019-01-28 NOTE — Telephone Encounter (Signed)
Community message sent to [facility] to get recent download. Once received will get to [facility] for review. Pt has been made aware of what is needed (compliance) to be cleared to return to work. Nothing further needed.

## 2019-01-30 ENCOUNTER — Other Ambulatory Visit: Payer: BLUE CROSS/BLUE SHIELD

## 2019-01-30 ENCOUNTER — Telehealth: Payer: Self-pay | Admitting: Pulmonary Disease

## 2019-01-30 DIAGNOSIS — J9610 Chronic respiratory failure, unspecified whether with hypoxia or hypercapnia: Secondary | ICD-10-CM

## 2019-01-30 DIAGNOSIS — G4733 Obstructive sleep apnea (adult) (pediatric): Secondary | ICD-10-CM

## 2019-01-30 NOTE — Telephone Encounter (Signed)
lmom for patient  To call back.

## 2019-01-30 NOTE — Telephone Encounter (Signed)
Pt made aware of results of sleep study. Orders have been placed. Pt inquiring about the letter clearing him to return to work. Let him know we are still waiting on download for NP.

## 2019-01-31 NOTE — Telephone Encounter (Signed)
Called pt to set up an appt for CPAP f/u. Pt states he was on hold with [facility] to find out where they stood with his machine. Pt agreed to scheduling a televisit appt w/ NP on 02/09/2019 at 2:00 PM. I also informed him to f/u with [place] if he doesn't receive his machine within the next week so we can receive a download for his return to work letter. Nothing further needed at this time.

## 2019-01-31 NOTE — Telephone Encounter (Signed)
See phone note from 01/30/2019.

## 2019-02-01 ENCOUNTER — Encounter: Payer: Self-pay | Admitting: Pulmonary Disease

## 2019-02-01 NOTE — Telephone Encounter (Signed)
LMTCB x 1 

## 2019-02-01 NOTE — Telephone Encounter (Signed)
Please contact the patient and let him know that he needs to improve his compliance with using his ventilator at home.  I am okay signing a note for him to go back to work.  Patient needs to keep tele-visit scheduled with EW in May/2020.  He can be continued to be reviewed at that time.Elisha Headland, FNP

## 2019-02-01 NOTE — Telephone Encounter (Signed)
Download received and give to [facility] for review.

## 2019-02-04 ENCOUNTER — Ambulatory Visit: Payer: BLUE CROSS/BLUE SHIELD | Admitting: Cardiology

## 2019-02-18 NOTE — Telephone Encounter (Signed)
Patient requested medical records. Directed him to Medical Records since our office does not handle those requests.   Will close encounter.

## 2019-02-21 NOTE — Telephone Encounter (Signed)
mychart message received from pt stating that he is still waiting for a return to work letter. Looked back at previous encounter from 3/26 to where [name] stated it was okay for pt to return back to work and we could go ahead and get a letter written so he is able to return back.  Pt still has not received the letter and I looked at the letters tab in pt's chart and there is nothing in here to where a letter was ever written for pt.  Let pt know in the mychart message that I will make sure the letter is written for him so he is able to return back to work. [name], please advise if there is anything specific that we need to state in the letter for pt so he is able to return back to work and I will then make sure the letter is written for pt. Thanks!

## 2019-02-21 NOTE — Telephone Encounter (Signed)
Letter has been written and sent to pt's mychart account at his request stating that he is able to return back to work. Pt was also made aware the other info stated by [name]. Nothing further needed.

## 2019-02-21 NOTE — Telephone Encounter (Signed)
02/21/2019 1159  As my note on 02/01/2019 states:   Please contact the patient and let him know that he needs to improve his compliance with using his ventilator at home.  I am okay signing a note for him to go back to work.  Patient needs to keep tele-visit scheduled with EW in May/2020.  He can be continued to be reviewed at that time.Elisha Headland, FNP  He can go back to work. He also needs to improve his compliance.   Keep televisit with EW in May/2020. Ensure he has 4 month follow up with Dr. Wynona Neat.   Elisha Headland FNP

## 2019-02-28 ENCOUNTER — Ambulatory Visit: Payer: BLUE CROSS/BLUE SHIELD | Admitting: Cardiology

## 2019-03-11 ENCOUNTER — Encounter: Payer: Self-pay | Admitting: Primary Care

## 2019-03-11 ENCOUNTER — Other Ambulatory Visit: Payer: Self-pay

## 2019-03-11 ENCOUNTER — Ambulatory Visit (INDEPENDENT_AMBULATORY_CARE_PROVIDER_SITE_OTHER): Payer: BLUE CROSS/BLUE SHIELD | Admitting: Primary Care

## 2019-03-11 DIAGNOSIS — G4733 Obstructive sleep apnea (adult) (pediatric): Secondary | ICD-10-CM

## 2019-03-11 NOTE — Patient Instructions (Addendum)
Obstructive sleep apnea - Aim to wear CPAP mask EVERY night for goal 4-6 hours - Do not drive if experiencing excessive daytime fatigue or somnolence - Do not use sedating medication or drink alcohol in excess prior to bedtime as these can worsen sleep apnea symptoms  Follow-up: 3-4 months with Dr. Wynona Neatlalere    CPAP and BPAP Information CPAP and BPAP are methods of helping a person breathe with the use of air pressure. CPAP stands for "continuous positive airway pressure." BPAP stands for "bi-level positive airway pressure." In both methods, air is blown through your nose or mouth and into your air passages to help you breathe well. CPAP and BPAP use different amounts of pressure to blow air. With CPAP, the amount of pressure stays the same while you breathe in and out. With BPAP, the amount of pressure is increased when you breathe in (inhale) so that you can take larger breaths. Your health care provider will recommend whether CPAP or BPAP would be more helpful for you. Why are CPAP and BPAP treatments used? CPAP or BPAP can be helpful if you have:  Sleep apnea.  Chronic obstructive pulmonary disease (COPD).  Heart failure.  Medical conditions that weaken the muscles of the chest including muscular dystrophy, or neurological diseases such as amyotrophic lateral sclerosis (ALS).  Other problems that cause breathing to be weak, abnormal, or difficult. CPAP is most commonly used for obstructive sleep apnea (OSA) to keep the airways from collapsing when the muscles relax during sleep. How is CPAP or BPAP administered? Both CPAP and BPAP are provided by a small machine with a flexible plastic tube that attaches to a plastic mask. You wear the mask. Air is blown through the mask into your nose or mouth. The amount of pressure that is used to blow the air can be adjusted on the machine. Your health care provider will determine the pressure setting that should be used based on your individual  needs. When should CPAP or BPAP be used? In most cases, the mask only needs to be worn during sleep. Generally, the mask needs to be worn throughout the night and during any daytime naps. People with certain medical conditions may also need to wear the mask at other times when they are awake. Follow instructions from your health care provider about when to use the machine. What are some tips for using the mask?   Because the mask needs to be snug, some people feel trapped or closed-in (claustrophobic) when first using the mask. If you feel this way, you may need to get used to the mask. One way to do this is by holding the mask loosely over your nose or mouth and then gradually applying the mask more snugly. You can also gradually increase the amount of time that you use the mask.  Masks are available in various types and sizes. Some fit over your mouth and nose while others fit over just your nose. If your mask does not fit well, talk with your health care provider about getting a different one.  If you are using a mask that fits over your nose and you tend to breathe through your mouth, a chin strap may be applied to help keep your mouth closed.  The CPAP and BPAP machines have alarms that may sound if the mask comes off or develops a leak.  If you have trouble with the mask, it is very important that you talk with your health care provider about finding a way  to make the mask easier to tolerate. Do not stop using the mask. Stopping the use of the mask could have a negative impact on your health. What are some tips for using the machine?  Place your CPAP or BPAP machine on a secure table or stand near an electrical outlet.  Know where the on/off switch is located on the machine.  Follow instructions from your health care provider about how to set the pressure on your machine and when you should use it.  Do not eat or drink while the CPAP or BPAP machine is on. Food or fluids could get pushed  into your lungs by the pressure of the CPAP or BPAP.  Do not smoke. Tobacco smoke residue can damage the machine.  For home use, CPAP and BPAP machines can be rented or purchased through home health care companies. Many different brands of machines are available. Renting a machine before purchasing may help you find out which particular machine works well for you.  Keep the CPAP or BPAP machine and attachments clean. Ask your health care provider for specific instructions. Get help right away if:  You have redness or open areas around your nose or mouth where the mask fits.  You have trouble using the CPAP or BPAP machine.  You cannot tolerate wearing the CPAP or BPAP mask.  You have pain, discomfort, and bloating in your abdomen. Summary  CPAP and BPAP are methods of helping a person breathe with the use of air pressure.  Both CPAP and BPAP are provided by a small machine with a flexible plastic tube that attaches to a plastic mask.  If you have trouble with the mask, it is very important that you talk with your health care provider about finding a way to make the mask easier to tolerate. This information is not intended to replace advice given to you by your health care provider. Make sure you discuss any questions you have with your health care provider. Document Released: 07/15/2004 Document Revised: 06/19/2018 Document Reviewed: 09/05/2016 Elsevier Interactive Patient Education  2019 [employer].

## 2019-03-11 NOTE — Progress Notes (Signed)
Virtual Visit via Telephone Note  I connected with Edward Castro on 03/11/19 at  2:00 PM EDT by telephone and verified that I am speaking with the correct person using two identifiers.  Location: Patient: Home Provider: Office   I discussed the limitations, risks, security and privacy concerns of performing an evaluation and management service by telephone and the availability of in person appointments. I also discussed with the patient that there may be a patient responsible charge related to this service. The patient expressed understanding and agreed to proceed.  Synopsis: Initially consulted with [facility] on 11/09/2018 when inpatient for acute on chronic respiratory failure with hypercarbia.  Patient had previously been managed at [facility] pulmonary group.  History of Present Illness: 33 year old male, never smoked. PMH significant for chronic respiratory failure with hypoxia (chronic oxygen), OSA, HTN, pulmonary hypertension, obesity, diabetes. Patient of Dr. Wynona Neat, last seen on 12/03/18. PFTs from 2017 did show moderate obstruction. Patient had split-night sleep study on 12/25/18, optimal pressure 15cm H20 with 4L oxygen and full face mask. AHI 80.4.   03/11/2019 Called today for 3 month follow-up. Patient feels well, no acute complaints. States that he was experiencing a lot of air leaks but this has mostly resolved. Using full face mask. DME company is [employer]). Moderate compliance, states that he has a weird sleep pattern since being out of work. He will be going back to work in a few weeks, works as a Forensic scientist for [employer]. States that he is a night owl, goes to bed after midnight and does sleep well. Denies trouble falling sleep.   Airview: Usage 20/33 days; 24% >4 hours Average hours- 3 hrs Pressure 15 cm H20 Airleaks- 40.1 L/min AHI 6.7   Observations/Objective:  -No shortness of breath, wheezing or  cough  Significant testing reviewed: CTA Chest 11/07/18 >> neg for PE, peribronchial thickening, no consolidation, 70mm RUL pulmonary nodule (no follow-up needed if patient is low-risk)  RUQ [place] 11/07/18 >> cholelithiasis, no acute cholecystitis, no biliary ductal dilation, small R effusion  CXR 11/09/2018>>Stable generalized cardiomegaly. No edema or consolidation.  11/08/2018-echocardiogram-LV ejection fraction 55 to 60%, no real apical images poor study  11/09/2018-Epworth score-16   Assessment and Plan:  OSA: - Airview download shows patient used CPAP device 20/33 days - Significant improvement in AHI at 6.7 with CPAP use  - Pressure 15cm H20, moderate airleaks - No changes today - Encourage enhanced compliance, aim to wear every night for 4-6 hours or more - Discussed different ways to improve mask leaks (wrong mask or size, frequent position changes and mask adjustment can contribute), if continues patient to contact DME company  - Advised patient against driving if experiencing excessive daytime fatigue/somnolence  Follow Up Instructions:  - 3 to 4 months with Dr. Wynona Neat    I discussed the assessment and treatment plan with the patient. The patient was provided an opportunity to ask questions and all were answered. The patient agreed with the plan and demonstrated an understanding of the instructions.   The patient was advised to call back or seek an in-person evaluation if the symptoms worsen or if the condition fails to improve as anticipated.  I provided 22 minutes of non-face-to-face time during this encounter.   Glenford Bayley, NP

## 2019-03-12 ENCOUNTER — Telehealth: Payer: Self-pay | Admitting: Primary Care

## 2019-03-12 NOTE — Telephone Encounter (Signed)
Paperwork completed.  Back to [facility].

## 2019-03-12 NOTE — Telephone Encounter (Signed)
Rec'd completed forms back - Fwd to [employer] via interoffice mail -pr  °

## 2019-03-18 NOTE — Telephone Encounter (Signed)
03/18/2019 2135  Triage, please route this patient's request to [facility].  She last saw the patient.  She can easily dictate from that note to return back to work date.  I last saw the patient 4 months ago.  Elisha Headland, FNP

## 2019-03-18 NOTE — Telephone Encounter (Signed)
Pt sent an email stating they need a more detailed note for their work stating they are able to work without any restrictions. Note was created back in 02/21/2019 stated below  Mr. Edward Castro has been under our care at [facility]. [name] is able to return back to work.  If you have any questions or concerns, please don't hesitate to call.  Sincerely,  Elisha Headland, NP  Please advise if you would like for me to add anything in specific. Thanks.

## 2019-03-19 NOTE — Telephone Encounter (Signed)
Message being routed to [facility] per Brian's request.  Waynetta Sandy - please advise. Thanks!

## 2019-03-19 NOTE — Telephone Encounter (Signed)
I think this was sent to [name] because he was the one that sent him to [employer] for disability. I do not see any medical reason why he can not work. Please write a note that states that patient can return to work with no restrictions from a pulmonary standpoint. He is compliant with CPAP when he sleeps and reports benefit from use. Thanks

## 2019-03-19 NOTE — Telephone Encounter (Signed)
Agreed.  Edward Castro

## 2019-04-09 ENCOUNTER — Other Ambulatory Visit: Payer: BLUE CROSS/BLUE SHIELD

## 2019-04-18 ENCOUNTER — Ambulatory Visit: Payer: BLUE CROSS/BLUE SHIELD | Admitting: Cardiology

## 2019-05-06 ENCOUNTER — Other Ambulatory Visit: Payer: Self-pay | Admitting: Cardiology

## 2019-05-06 DIAGNOSIS — R0989 Other specified symptoms and signs involving the circulatory and respiratory systems: Secondary | ICD-10-CM

## 2019-05-07 ENCOUNTER — Telehealth: Payer: Self-pay

## 2019-05-07 ENCOUNTER — Other Ambulatory Visit: Payer: BC Managed Care – PPO

## 2019-05-07 DIAGNOSIS — Z5329 Procedure and treatment not carried out because of patient's decision for other reasons: Secondary | ICD-10-CM

## 2019-05-15 ENCOUNTER — Ambulatory Visit: Payer: BLUE CROSS/BLUE SHIELD | Admitting: Cardiology

## 2019-08-07 ENCOUNTER — Encounter: Payer: Self-pay | Admitting: Cardiology

## 2019-08-08 ENCOUNTER — Other Ambulatory Visit (HOSPITAL_COMMUNITY): Payer: Self-pay | Admitting: Cardiology

## 2019-08-08 ENCOUNTER — Encounter: Payer: Self-pay | Admitting: Cardiology

## 2019-08-08 ENCOUNTER — Ambulatory Visit (INDEPENDENT_AMBULATORY_CARE_PROVIDER_SITE_OTHER): Payer: BC Managed Care – PPO | Admitting: Cardiology

## 2019-08-08 ENCOUNTER — Other Ambulatory Visit: Payer: Self-pay

## 2019-08-08 VITALS — BP 151/97 | HR 103 | Ht 68.0 in | Wt 359.5 lb

## 2019-08-08 DIAGNOSIS — R600 Localized edema: Secondary | ICD-10-CM | POA: Diagnosis not present

## 2019-08-08 DIAGNOSIS — I5032 Chronic diastolic (congestive) heart failure: Secondary | ICD-10-CM | POA: Diagnosis not present

## 2019-08-08 DIAGNOSIS — I10 Essential (primary) hypertension: Secondary | ICD-10-CM | POA: Diagnosis not present

## 2019-08-08 DIAGNOSIS — G4733 Obstructive sleep apnea (adult) (pediatric): Secondary | ICD-10-CM

## 2019-08-08 NOTE — Progress Notes (Signed)
Patient referred by Laqueta Due., MD for edema  Subjective:   Edward Castro, male    DOB: 1986-02-10, 33 y.o.   MRN: [id number]   Chief Complaint  Patient presents with  . Hypertension  . Congestive Heart Failure  . Follow-up    HPI   33 year old male with morbid obesity hypertension, type 2 diabetes mellitus, history of DVT, polycythemia,? COPD, BMI 50, suspected obstructive sleep apnea and obesity hypoventilation syndrome- on CPAP.  Patient recently had episodes of retrosternal sharp chest pain that lasted for few seconds.  No clear correlation with physical activity.  However, his blood pressure is elevated.  He also has significant leg edema.  Initial visit note: 33 year old male with morbid obesity hypertension, type 2 diabetes mellitus, history of DVT, polycythemia,? COPD, BMI 50, suspected obstructive sleep apnea and obesity hypoventilation syndrome, pending sleep study, referred for evaluation of cardiomegaly and leg edema.  Patient was recently hospitalized with shortness of breath thought to be combination of acute on chronic respiratory failure, and acute on chronic diastolic heart failure.  Patient recently underwent echocardiogram, which was extremely limited in its visualization due to patient's body habitus.  LVEF was normal.  Patient is here with his father today.  He lives with his girlfriend and her kids, and works at [employer].  His work involves heavy lifting.  He also walks twice a day, up to 3 months every day.  With this level of activity, he denies chest pain, shortness of breath, palpitations, leg edema, orthopnea, PND, TIA/syncope. Historically, he has had leg edema, but has improved recently.  He is on 2 antihypertensive medications.  Is also on metformin for diabetes.  He is trying to lose weight with diet and exercise changes.  He usually eats vegetables and lean meat and has strictly cut down his red meat intake.  However, he does not count  calories.  He recently underwent sleep study, awaiting results.   Past Medical History:  Diagnosis Date  . Acute on chronic diastolic heart failure (HCC) 11/08/2018  . Acute respiratory failure with hypoxia (HCC)   . Aphakia of both eyes   . Chest pain   . CHF (congestive heart failure) (HCC)   . CHF exacerbation (HCC) 11/07/2018  . Conjunctivitis   . COPD (chronic obstructive pulmonary disease) (HCC)   . COPD (chronic obstructive pulmonary disease) (HCC)   . Diabetes mellitus without complication (HCC)   . DVT (deep venous thrombosis) (HCC)   . H/O cardiomegaly   . Hypertension   . Morbid obesity (HCC)   . OSA (obstructive sleep apnea)   . Pulmonary hypertension (HCC)   . Pulmonary hypertension (HCC)   . 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 CATARACT EXTRACTION    . EYE SURGERY    . RETINAL DETACHMENT SURGERY       Social History   Socioeconomic History  . Marital status: Single    Spouse name: Not on file  . Number of children: 0  . Years of education: Not on file  . Highest education level: Not on file  Occupational History  . Not on file  Social Needs  . Financial resource strain: Not on file  . Food insecurity    Worry: Not on file    Inability: Not on file  . Transportation needs    Medical: Not on file    Non-medical: Not on file  Tobacco Use  . Smoking status: Never Smoker  . Smokeless tobacco: Never Used  Substance and Sexual Activity  . Alcohol use: No  . Drug use: No  . Sexual activity: Not on file  Lifestyle  . Physical activity    Days per week: Not on file    Minutes per session: Not on file  . Stress: Not on file  Relationships  . Social Musicianconnections    Talks on phone: Not on file    Gets together: Not on file    Attends religious service: Not on file    Active member of club or organization: Not on file    Attends meetings of clubs or organizations: Not on  file    Relationship status: Not on file  . Intimate partner violence    Fear of current or ex partner: Not on file    Emotionally abused: Not on file    Physically abused: Not on file    Forced sexual activity: Not on file  Other Topics Concern  . Not on file  Social History Narrative  . Not on file     Current Outpatient Medications on File Prior to Visit  Medication Sig Dispense Refill  . dextromethorphan-guaiFENesin (MUCINEX DM) 30-600 MG 12hr tablet Take 2 tablets by mouth 2 (two) times daily as needed for cough (and mucous). 30 tablet 0  . furosemide (LASIX) 40 MG tablet Take 40 mg by mouth daily.     . hydrALAZINE (APRESOLINE) 25 MG tablet Take 25 mg by mouth 2 (two) times daily.    . metFORMIN (GLUCOPHAGE) 500 MG tablet Take 500 mg by mouth daily.    . metoprolol tartrate (LOPRESSOR) 100 MG tablet Take 100 mg by mouth 2 (two) times daily.    . pravastatin (PRAVACHOL) 20 MG tablet Take 1 tablet by mouth daily.     No current facility-administered medications on file prior to visit.     Cardiovascular studies:  EKG 08/08/2019: Sinus tachycardia 103 bpm. left atrial enlargement. IVCD Right axis deviation.  Baseline wander.   Echocardiogram  11/08/2018: - Left ventricle: The cavity size was normal. Wall thickness was   increased in a pattern of mild LVH. Systolic function was normal.   The estimated ejection fraction was in the range of 55% to 60%.   The study is not technically sufficient to allow evaluation of LV   diastolic function. - Left atrium: The atrium was mildly dilated. - Right ventricle: Poorly visualized appears dilated - Impressions: No real apical images poor study.  Impressions:  - No real apical images poor study.  Review of Systems  Constitution: Negative for decreased appetite, malaise/fatigue, weight gain and weight loss.  HENT: Negative for congestion.   Eyes: Negative for visual disturbance.  Cardiovascular: Positive for dyspnea on  exertion and leg swelling. Negative for chest pain, palpitations and syncope.  Respiratory: Positive for shortness of breath.   Endocrine: Negative for cold intolerance.  Hematologic/Lymphatic: Does not bruise/bleed easily.  Skin: Negative for itching and rash.  Musculoskeletal: Negative for myalgias.  Gastrointestinal: Negative for abdominal pain, nausea and vomiting.  Genitourinary: Negative for dysuria.  Neurological: Negative for dizziness and weakness.  Psychiatric/Behavioral: The patient is not nervous/anxious.   All other systems reviewed and are negative.        Vitals:   08/08/19 1043 08/08/19 1056  BP: (!) 149/115 (!) 151/97  Pulse: 100 (!) 103  SpO2: 90%     Objective:   Physical Exam  Constitutional: He  is oriented to person, place, and time. He appears well-developed and well-nourished. No distress.  Morbid obesity  HENT:  Head: Normocephalic and atraumatic.  Eyes: Pupils are equal, round, and reactive to light. Conjunctivae are normal.  Neck: No JVD present.  Cardiovascular: Normal rate, regular rhythm and intact distal pulses.  Pulmonary/Chest: Effort normal and breath sounds normal. He has no wheezes. He has no rales.  Abdominal: Soft. Bowel sounds are normal. There is no rebound.  Musculoskeletal:        General: Edema (2+ b/l) present.  Lymphadenopathy:    He has no cervical adenopathy.  Neurological: He is alert and oriented to person, place, and time. No cranial nerve deficit.  Skin: Skin is warm and dry.  Psychiatric: He has a normal mood and affect.  Nursing note and vitals reviewed.         Assessment & Recommendations:    33 year old male with morbid obesity hypertension, type 2 diabetes mellitus, history of DVT, polycythemia,? COPD, BMI 50, suspected obstructive sleep apnea and obesity hypoventilation syndrome- on CPAP  Leg edema: Likely due to uncontrolled hypertension.  Ideally, would like to start him on spironolactone.  However, I do  not have any recent lab work on him.  In the past, he has had potassium greater than 5.0.  Recommend BMP today.  Based on BMP, will likely start him on spironolactone 25-50 mg daily.  In the meantime, increase Lasix to 40 mg twice daily.  Continue current antihypertensive therapy.  Of note, blood pressures are bilaterally equal.  Previous recording of unequal pressures in both arms likely erroneous.  Chest pain: Possibly related to fluid overload.  While he has risk factors for CAD, low suspicion for angina at this time.  Morbid obesity: Again discussed importance of weight loss.  He is not interested in referral to weight loss clinic at this time.    Okay to resume work unless limited by symptoms of chest pain shortness of breath.  Follow-up in 4 weeks.  Nigel Mormon, MD [facility] Pager: [phone] Office: [phone] If no answer Cell [phone]

## 2019-08-09 LAB — BASIC METABOLIC PANEL
BUN/Creatinine Ratio: 10 (ref 9–20)
BUN: 12 mg/dL (ref 6–20)
CO2: 27 mmol/L (ref 20–29)
Calcium: 9.7 mg/dL (ref 8.7–10.2)
Chloride: 96 mmol/L (ref 96–106)
Creatinine, Ser: 1.16 mg/dL (ref 0.76–1.27)
GFR calc Af Amer: 94 mL/min/{1.73_m2} (ref >59)
GFR calc non Af Amer: 82 mL/min/{1.73_m2} (ref >59)
Glucose: 124 mg/dL — ABNORMAL HIGH (ref 65–99)
Potassium: 5 mmol/L (ref 3.5–5.2)
Sodium: 141 mmol/L (ref 134–144)

## 2019-08-09 LAB — BRAIN NATRIURETIC PEPTIDE: BNP: 112.1 pg/mL — ABNORMAL HIGH (ref 0.0–100.0)

## 2019-08-09 NOTE — Progress Notes (Signed)
Left VM with details.  

## 2019-08-09 NOTE — Progress Notes (Signed)
Potassium is upper normal at 5.0, thus not able to use a medication that I was hoping to use-spironolactone- as it can increase potassium. Conitnue lasix at increased dose of 40 mg twice daily. Keep f/u on 11/5.  Thanks MJP

## 2019-09-05 ENCOUNTER — Ambulatory Visit: Payer: BC Managed Care – PPO | Admitting: Cardiology

## 2019-09-15 ENCOUNTER — Other Ambulatory Visit: Payer: Self-pay

## 2019-09-15 ENCOUNTER — Emergency Department (HOSPITAL_COMMUNITY)
Admission: EM | Admit: 2019-09-15 | Discharge: 2019-09-15 | Disposition: A | Discharge status: Home / Self Care | Payer: BLUE CROSS/BLUE SHIELD | Attending: Emergency Medicine | Admitting: Emergency Medicine

## 2019-09-15 ENCOUNTER — Encounter (HOSPITAL_COMMUNITY): Payer: Self-pay | Admitting: *Deleted

## 2019-09-15 ENCOUNTER — Emergency Department (HOSPITAL_COMMUNITY): Payer: BLUE CROSS/BLUE SHIELD

## 2019-09-15 DIAGNOSIS — I110 Hypertensive heart disease with heart failure: Secondary | ICD-10-CM | POA: Diagnosis not present

## 2019-09-15 DIAGNOSIS — Z7984 Long term (current) use of oral hypoglycemic drugs: Secondary | ICD-10-CM | POA: Insufficient documentation

## 2019-09-15 DIAGNOSIS — R600 Localized edema: Secondary | ICD-10-CM | POA: Insufficient documentation

## 2019-09-15 DIAGNOSIS — I5033 Acute on chronic diastolic (congestive) heart failure: Secondary | ICD-10-CM | POA: Diagnosis not present

## 2019-09-15 DIAGNOSIS — Z79899 Other long term (current) drug therapy: Secondary | ICD-10-CM | POA: Insufficient documentation

## 2019-09-15 DIAGNOSIS — R0602 Shortness of breath: Secondary | ICD-10-CM | POA: Diagnosis present

## 2019-09-15 DIAGNOSIS — J449 Chronic obstructive pulmonary disease, unspecified: Secondary | ICD-10-CM | POA: Insufficient documentation

## 2019-09-15 DIAGNOSIS — I509 Heart failure, unspecified: Secondary | ICD-10-CM

## 2019-09-15 DIAGNOSIS — J9610 Chronic respiratory failure, unspecified whether with hypoxia or hypercapnia: Secondary | ICD-10-CM | POA: Insufficient documentation

## 2019-09-15 DIAGNOSIS — E119 Type 2 diabetes mellitus without complications: Secondary | ICD-10-CM | POA: Insufficient documentation

## 2019-09-15 LAB — CBC WITH DIFFERENTIAL/PLATELET
Abs Immature Granulocytes: 0.01 10*3/uL (ref 0.00–0.07)
Basophils Absolute: 0 10*3/uL (ref 0.0–0.1)
Basophils Relative: 1 %
Eosinophils Absolute: 0 10*3/uL (ref 0.0–0.5)
Eosinophils Relative: 0 %
HCT: 66 % — ABNORMAL HIGH (ref 39.0–52.0)
Hemoglobin: 19.8 g/dL — ABNORMAL HIGH (ref 13.0–17.0)
Immature Granulocytes: 0 %
Lymphocytes Relative: 14 %
Lymphs Abs: 1.1 10*3/uL (ref 0.7–4.0)
MCH: 30.5 pg (ref 26.0–34.0)
MCHC: 30 g/dL (ref 30.0–36.0)
MCV: 101.5 fL — ABNORMAL HIGH (ref 80.0–100.0)
Monocytes Absolute: 0.7 10*3/uL (ref 0.1–1.0)
Monocytes Relative: 9 %
Neutro Abs: 5.6 10*3/uL (ref 1.7–7.7)
Neutrophils Relative %: 76 %
Platelets: 175 10*3/uL (ref 150–400)
RBC: 6.5 MIL/uL — ABNORMAL HIGH (ref 4.22–5.81)
RDW: 18.2 % — ABNORMAL HIGH (ref 11.5–15.5)
WBC: 7.4 10*3/uL (ref 4.0–10.5)
nRBC: 0.9 % — ABNORMAL HIGH (ref 0.0–0.2)

## 2019-09-15 LAB — BASIC METABOLIC PANEL
Anion gap: 11 (ref 5–15)
BUN: 11 mg/dL (ref 6–20)
CO2: 32 mmol/L (ref 22–32)
Calcium: 8.9 mg/dL (ref 8.9–10.3)
Chloride: 100 mmol/L (ref 98–111)
Creatinine, Ser: 1.06 mg/dL (ref 0.61–1.24)
GFR calc Af Amer: >60 mL/min (ref >60)
GFR calc non Af Amer: >60 mL/min (ref >60)
Glucose, Bld: 108 mg/dL — ABNORMAL HIGH (ref 70–99)
Potassium: 4.7 mmol/L (ref 3.5–5.1)
Sodium: 143 mmol/L (ref 135–145)

## 2019-09-15 LAB — BRAIN NATRIURETIC PEPTIDE: B Natriuretic Peptide: 283.3 pg/mL — ABNORMAL HIGH (ref 0.0–100.0)

## 2019-09-15 MED ORDER — MUPIROCIN CALCIUM 2 % EX CREA: 1.0000 "application " | TOPICAL_CREAM | Freq: Two times a day (BID) | CUTANEOUS | 0 refills | Status: AC

## 2019-09-15 MED ORDER — FUROSEMIDE 20 MG PO TABS: 20.0000 mg | ORAL_TABLET | Freq: Every day | ORAL | 0 refills | Status: AC

## 2019-09-15 MED ORDER — FUROSEMIDE 10 MG/ML IJ SOLN
40.0000 mg | Freq: Once | INTRAMUSCULAR | Status: AC
  Administered 2019-09-15: 40 mg via INTRAVENOUS
  Filled 2019-09-15: qty 4

## 2019-09-15 MED ORDER — MORPHINE SULFATE (PF) 4 MG/ML IV SOLN: 4.0000 mg | Freq: Once | INTRAVENOUS | Status: DC

## 2019-09-15 NOTE — ED Provider Notes (Addendum)
[facility] EMERGENCY DEPARTMENT Provider Note   CSN: [id number] Arrival date & time: 09/15/19  1604     History   Chief Complaint Chief Complaint  Patient presents with   Shortness of Breath    HPI Edward Castro is a 33 y.o. male.     33 year old obese male with past medical history of CHF, COPD and chronic respiratory failure presenting to the emergency department from urgent care for low oxygen.  Patient reports that he went to urgent care for evaluation of bilateral lower extremity edema and concern for possible cellulitis.  He reports that when his vitals were taken his oxygen was low and his heart rate was elevated so they transferred him to the emergency department.  He reports that he has not felt any more short of breath than his usual.  He denies any chest pain, cough, fever, chills, nausea, vomiting, orthopnea.  Reports that he has chronic lower extremity edema but over the last week his legs seem to be getting worsening swelling.  Reports that he did elevate his legs last night and that this helped and the swelling went down today.  Denies any increased warmth or increased erythema.  He also reports that the erythema actually has gotten better.  He does have a skin tear to his anterior right shin which he reports he has been doing home wound care with.  Reports it is not getting any worse.  He does have oxygen at home but reports he only wears it "as needed" and does not feel like he has had to use it any more than usual.  According to my review of notes it looks like he was last discharged from the hospital for acute respiratory distress in January and was advised to wear 2 L of oxygen at rest and 3 L with exertion at all times.  He is on CPAP machine.     Past Medical History:  Diagnosis Date   Acute on chronic diastolic heart failure (HCC) 11/08/2018   Acute respiratory failure with hypoxia (HCC)    Aphakia of both eyes    Chest pain    CHF  (congestive heart failure) (HCC)    CHF exacerbation (HCC) 11/07/2018   Conjunctivitis    COPD (chronic obstructive pulmonary disease) (HCC)    COPD (chronic obstructive pulmonary disease) (HCC)    Diabetes mellitus without complication (HCC)    DVT (deep venous thrombosis) (HCC)    H/O cardiomegaly    Hypertension    Morbid obesity (HCC)    OSA (obstructive sleep apnea)    Pulmonary hypertension (HCC)    Pulmonary hypertension (HCC)    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     Patient Active Problem List   Diagnosis Date Noted   Leg edema 08/08/2019   Pulmonary hypertension (HCC)    H/O cardiomegaly    Morbid obesity (HCC)    OSA (obstructive sleep apnea)    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    Aphakia of both eyes    COPD (chronic obstructive pulmonary disease) (HCC)    Suspected sleep apnea 11/16/2018   Morbid (severe) obesity due to excess calories (HCC) 11/16/2018   Edema 11/16/2018   Chronic respiratory failure with hypoxia and hypercapnia (HCC) 11/16/2018   Chronic diastolic (congestive) heart failure (HCC) 11/08/2018   Diabetes mellitus without complication (HCC)    DVT (deep venous thrombosis) (HCC)    Acute respiratory failure with hypoxia (HCC) 11/07/2018  Acute bronchitis 11/07/2018   Chest pain 11/07/2018   Polycythemia 11/07/2018   Pulmonary nodule 11/07/2018   Essential hypertension 11/07/2018    Past Surgical History:  Procedure Laterality Date   CATARACT EXTRACTION     EYE SURGERY     RETINAL DETACHMENT SURGERY          Home Medications    Prior to Admission medications   Medication Sig Start Date End Date Taking? Authorizing Provider  dextromethorphan-guaiFENesin (MUCINEX DM) 30-600 MG 12hr tablet Take 2 tablets by mouth 2 (two) times daily as needed for cough (and mucous). 11/11/18   Debbe Odea, MD  furosemide (LASIX) 20 MG tablet Take 1 tablet (20 mg total)  by mouth daily for 4 days. 09/15/19 09/19/19  Madilyn Hook A, PA-C  furosemide (LASIX) 40 MG tablet Take 40 mg by mouth 2 (two) times daily.    [provider]  hydrALAZINE (APRESOLINE) 25 MG tablet Take 25 mg by mouth 2 (two) times daily.    [provider]  metFORMIN (GLUCOPHAGE) 500 MG tablet Take 500 mg by mouth daily. 04/24/18   [provider]  metoprolol tartrate (LOPRESSOR) 100 MG tablet Take 100 mg by mouth 2 (two) times daily. 11/01/16   [provider]  mupirocin cream (BACTROBAN) 2 % Apply 1 application topically 2 (two) times daily. 09/15/19   Madilyn Hook A, PA-C  pravastatin (PRAVACHOL) 20 MG tablet Take 1 tablet by mouth daily. 01/11/19 01/11/20  [provider]    Family History Family History  Problem Relation Age of Onset   Diabetes Father    Diabetes Paternal Aunt    Hypertension Mother     Social History Social History   Tobacco Use   Smoking status: Never Smoker   Smokeless tobacco: Never Used  Substance Use Topics   Alcohol use: Yes    Comment: socially   Drug use: No     Allergies   Patient has no known allergies.   Review of Systems Review of Systems  Constitutional: Negative for appetite change, chills and fever.  HENT: Negative for congestion, sneezing and sore throat.   Respiratory: Negative for cough and shortness of breath.   Cardiovascular: Negative for chest pain.  Gastrointestinal: Negative for abdominal pain, nausea and vomiting.  Genitourinary: Negative for dysuria.  Musculoskeletal: Negative for arthralgias.  Neurological: Negative for dizziness, syncope, light-headedness and headaches.  Hematological: Does not bruise/bleed easily.     Physical Exam Updated Vital Signs BP (!) 139/110 (BP Location: Right Arm)    Pulse 97    Temp 98.3 F (36.8 C) (Oral)    Resp (!) 24    Wt (!) 163.1 kg    SpO2 94%    BMI 54.67 kg/m   Physical Exam Vitals and nursing note reviewed.    Constitutional:      General: He is not in acute distress.    Appearance: Normal appearance. He is well-developed. He is obese. He is not ill-appearing, toxic-appearing or diaphoretic.  HENT:     Head: Normocephalic.  Eyes:     Conjunctiva/sclera: Conjunctivae normal.  Cardiovascular:     Rate and Rhythm: Normal rate and regular rhythm.  Pulmonary:     Effort: Pulmonary effort is normal. No tachypnea, accessory muscle usage or respiratory distress.     Breath sounds: Decreased breath sounds present.  Chest:     Chest wall: No deformity.  Musculoskeletal:     Right lower leg: Edema present.     Left lower leg:  Edema present.  Skin:    General: Skin is dry.     Capillary Refill: Capillary refill takes less than 2 seconds.  Neurological:     Mental Status: He is alert.  Psychiatric:        Mood and Affect: Mood normal.      ED Treatments / Results  Labs (all labs ordered are listed, but only abnormal results are displayed) Labs Reviewed  BASIC METABOLIC PANEL - Abnormal; Notable for the following components:      Result Value   Glucose, Bld 108 (*)    All other components within normal limits  CBC WITH DIFFERENTIAL/PLATELET - Abnormal; Notable for the following components:   RBC 6.50 (*)    Hemoglobin 19.8 (*)    HCT 66.0 (*)    MCV 101.5 (*)    RDW 18.2 (*)    nRBC 0.9 (*)    All other components within normal limits  BRAIN NATRIURETIC PEPTIDE - Abnormal; Notable for the following components:   B Natriuretic Peptide 283.3 (*)    All other components within normal limits    EKG EKG Interpretation  Date/Time:  Sunday September 15 2019 16:25:21 EST Ventricular Rate:  104 PR Interval:    QRS Duration: 98 QT Interval:  359 QTC Calculation: 473 R Axis:   152 Text Interpretation: Sinus tachycardia Prolonged PR interval Right atrial enlargement Lateral infarct, old Probable anteroseptal infarct, old since last tracing no significant change Confirmed by Rolan Bucco  [phone]) on 09/15/2019 4:29:56 PM   Radiology No results found.  Procedures Procedures (including critical care time)  Medications Ordered in ED Medications  furosemide (LASIX) injection 40 mg (40 mg Intravenous Given 09/15/19 1811)     Initial Impression / Assessment and Plan / ED Course  I have reviewed the triage vital signs and the nursing notes.  Pertinent labs & imaging results that were available during my care of the patient were reviewed by me and considered in my medical decision making (see chart for details).  Clinical Course as of Oct 15 1011  Sun Sep 15, 2019  6045 Patient presenting for BLE. Originally seen at [facility] for the same but was sent here due to abnormal vitals (oxygen sats 85% and mildly tachy.) Patient not complaining of any SOB or any cardio/pulmonary symptoms. On my exam appears well oxygenated but does have low sats in the 80s on room air.  NAD. Upon my review of patient's last hospitalization patient was supposed to be on 24hr home oxygen but he states he only uses is "prn" and has not needed it lately. His leg appear chronically edematous without signs of cellulitis and he has normal WBC count. EKG without emergent findings and chest xray showing mild CHF and BNP elevated. I think patient may have mild acute CHF exacerbation so IV lasix were given. Leg edema improved with elevation. Discussed observation vs admission but patient states he feels his baseline. I encouraged him to wear his oxygen 24/7 as he was instructed by pulmonology. I encouraged increased elevation of his lower extremities and compression socks. He does have a skin tear to his lower leg but this does not look infected. I will prescribed mupirocin and he is to f/u with PMD, pulmonology and cardiology as scheduled. Advised on strict return precautions. Patient will also have an increase of his lasix at home for the next few days. Case was discussed with Dr. Fredderick Phenix and plan agreed upon.   [KM]      Clinical  Course User Index [KM] Arlyn DunningMcLean, Anica Alcaraz A, PA-C       Based on review of vitals, medical screening exam, lab work and/or imaging, there does not appear to be an acute, emergent etiology for the patient's symptoms. Counseled pt on good return precautions and encouraged both PCP and ED follow-up as needed.  Prior to discharge, I also discussed incidental imaging findings with patient in detail and advised appropriate, recommended follow-up in detail.  Clinical Impression: 1. Acute congestive heart failure, unspecified heart failure type (HCC)   2. Bilateral lower extremity edema   3. Chronic respiratory failure, unspecified whether with hypoxia or hypercapnia (HCC)     Disposition: Discharge  Prior to providing a prescription for a controlled substance, I independently reviewed the patient's recent prescription history on the [place] Controlled Substance Reporting System. The patient had no recent or regular prescriptions and was deemed appropriate for a brief, less than 3 day prescription of narcotic for acute analgesia.  This note was prepared with assistance of Conservation officer, historic buildingsDragon voice recognition software. Occasional wrong-word or sound-a-like substitutions may have occurred due to the inherent limitations of voice recognition software.   Final Clinical Impressions(s) / ED Diagnoses   Final diagnoses:  Acute congestive heart failure, unspecified heart failure type (HCC)  Bilateral lower extremity edema  Chronic respiratory failure, unspecified whether with hypoxia or hypercapnia [facility](HCC)    ED Discharge Orders         Ordered    furosemide (LASIX) 20 MG tablet  Daily     09/15/19 1858    mupirocin cream (BACTROBAN) 2 %  2 times daily     09/15/19 1858           Arlyn DunningMcLean, Aisha Greenberger A, PA-C 09/15/19 1859    Arlyn DunningMcLean, Takari Lundahl A, PA-C 09/15/19 Ar[place] Care1955    Belfi, Melanie, MD 09/15/19 2119    Arlyn DunningMcLean, Manaal Mandala A, PA-C 10/15/19 1012    Rolan BuccoBelfi, Melanie, MD 10/15/19 2022

## 2019-09-15 NOTE — Discharge Instructions (Addendum)
You were seen today for lower leg swelling.  Urgent care was concerned because your oxygen levels were low.  You should be wearing your oxygen 24 hours out of the day.  You should wear it at 2 L when you are at rest and 3 L when you exert yourself.  You should elevate your legs as much as possible when you are not using them.  You should use compression stockings or socks.  Make sure you clean your wound daily and have your wound checked up on in a couple of days. You were given a dose of IV lasix today to help with the leg swelling and because you are having a mild flare in your heart failure. I have also prescribed an addition 20mg  of lasix to take for the next 4 days along with your regular dose of lasix. I have also given you a prescription antibiotic ointment you can use on your leg wound. Follow up without regular doctor and pulmonologist and cardiologist and return to the ER if you have new or worsening symptoms.

## 2019-09-15 NOTE — ED Notes (Signed)
Patient verbalizes understanding of discharge instructions. Opportunity for questioning and answers were provided. Armband removed by staff, pt discharged from ED.  

## 2019-09-15 NOTE — ED Triage Notes (Signed)
Pt from [facility] via EMS . Pt went to [facility] for ongoing SHOB and cellulitis of legs. Pt last saw [facility] in OCT and his Lasix was increased.  Pt had an appt. Recently but did not go to [facility] because he had a sniffle . EMS reported Pt was ambulated at [facility] with pulse Ox on room air and SPO2 dropped to 78%.  Pt arrived to Ed on 4 liters nasal O2 with SPO2 94%.  Pt reports he uses O2 at home PRN but has not had an increased need for his O2.

## 2019-09-15 NOTE — ED Notes (Signed)
Pt waiting on girlfriend to bring O2 tank in order to take pt home.

## 2020-02-19 MED ORDER — ATORVASTATIN CALCIUM 40 MG PO TABS
40.00 | ORAL_TABLET | ORAL | Status: DC
Start: 2020-02-19 — End: 2020-02-19

## 2020-02-19 MED ORDER — DEXTROSE 10 % IV SOLN
125.00 | INTRAVENOUS | Status: DC
Start: ? — End: 2020-02-19

## 2020-02-19 MED ORDER — ASPIRIN 81 MG PO CHEW
81.00 | CHEWABLE_TABLET | ORAL | Status: DC
Start: 2020-02-20 — End: 2020-02-19

## 2020-02-19 MED ORDER — INSULIN LISPRO 100 UNIT/ML ~~LOC~~ SOLN
3.00 | SUBCUTANEOUS | Status: DC
Start: 2020-02-19 — End: 2020-02-19

## 2020-02-19 MED ORDER — FUROSEMIDE 20 MG PO TABS
40.00 | ORAL_TABLET | ORAL | Status: DC
Start: 2020-02-20 — End: 2020-02-19

## 2020-02-19 MED ORDER — ACETAMINOPHEN 325 MG PO TABS
650.00 | ORAL_TABLET | ORAL | Status: DC
Start: ? — End: 2020-02-19

## 2020-02-19 MED ORDER — HYDRALAZINE HCL 25 MG PO TABS
25.00 | ORAL_TABLET | ORAL | Status: DC
Start: 2020-02-19 — End: 2020-02-19

## 2020-02-19 MED ORDER — ENOXAPARIN SODIUM 40 MG/0.4ML ~~LOC~~ SOLN
40.00 | SUBCUTANEOUS | Status: DC
Start: 2020-02-20 — End: 2020-02-19

## 2020-02-19 MED ORDER — GLUCOSE 40 % PO GEL
15.00 | ORAL | Status: DC
Start: ? — End: 2020-02-19

## 2020-10-07 IMAGING — US US ABDOMEN LIMITED
1 series · 14 of 25 positions shown · non-contrast
Comparison: None.

CLINICAL DATA: Elevated liver function tests. Chest pain. Dyspnea.

EXAM:
ULTRASOUND ABDOMEN LIMITED RIGHT UPPER QUADRANT

[Series 1: us abdomen limited · 14 of 27 slices shown]
[im 1/27]
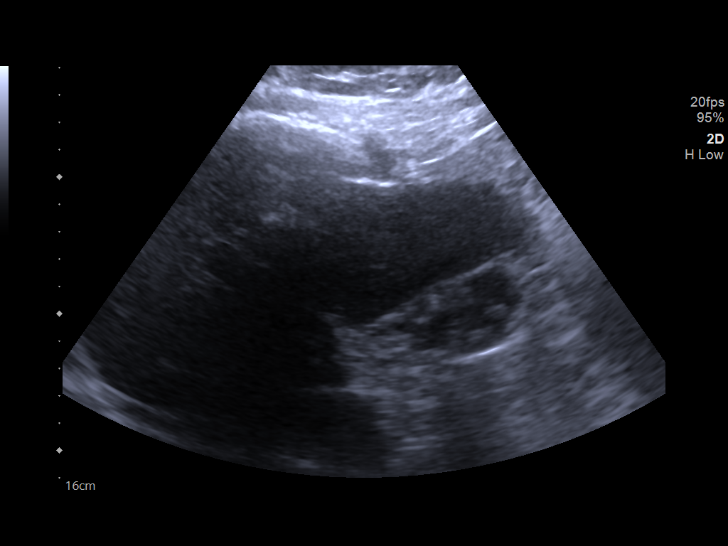
[im 3/27]
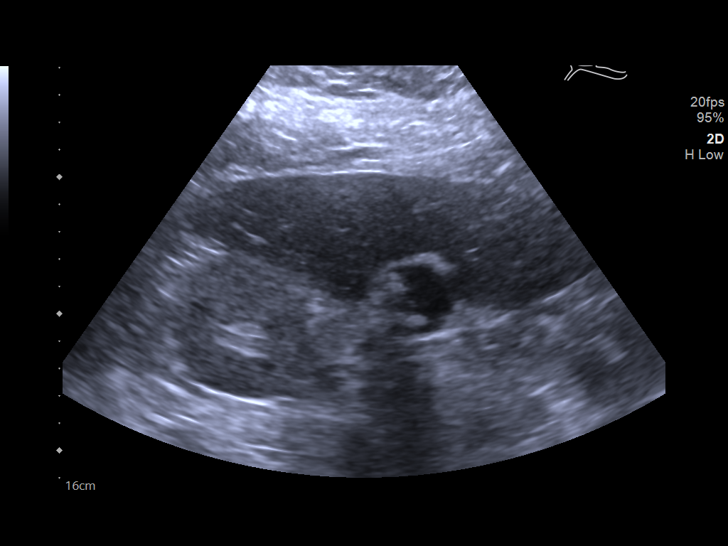
[im 5/27]
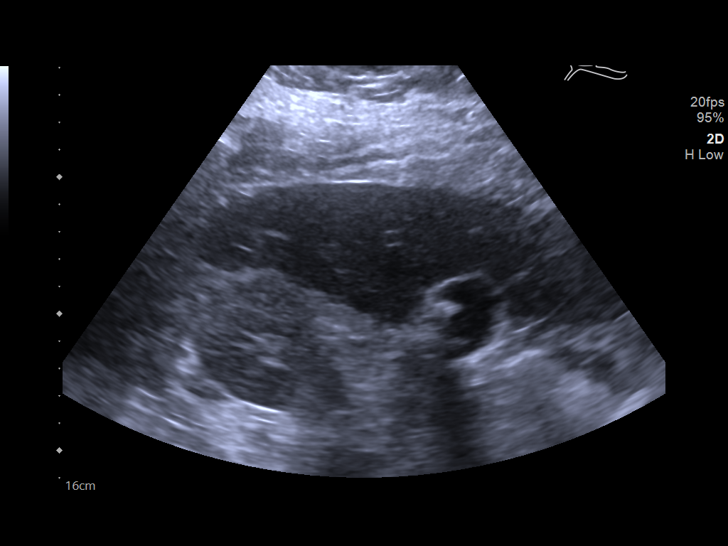
[im 7/27]
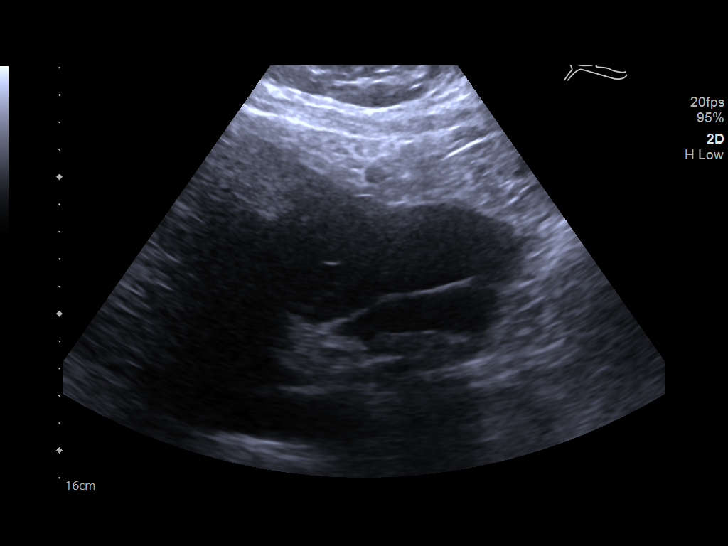
[im 9/27]
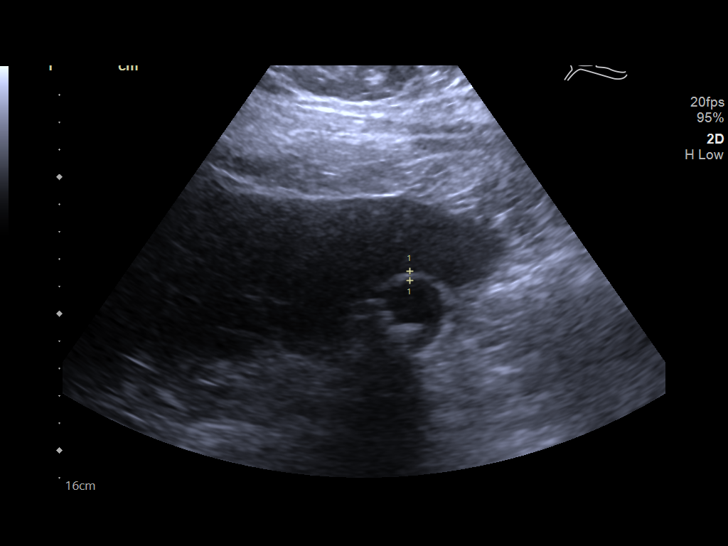
[im 10/27]
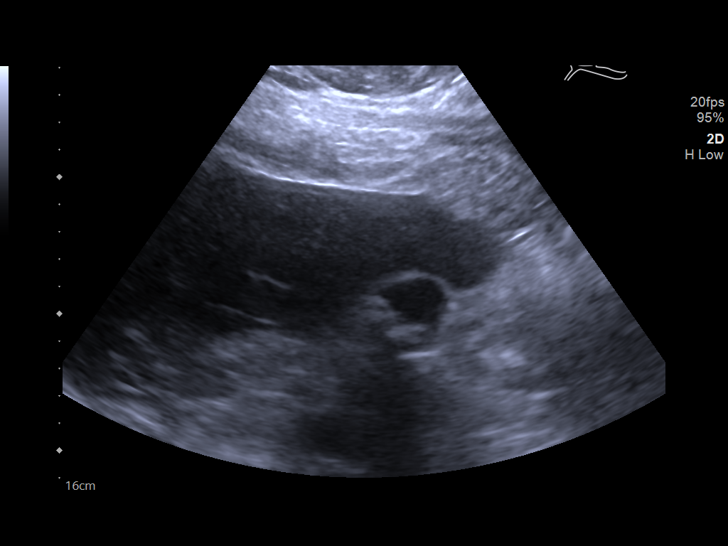
[im 12/27]
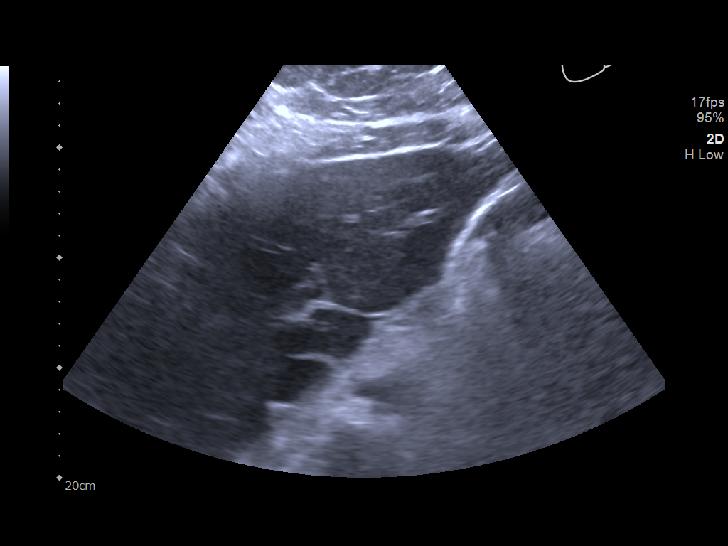
[im 15/27]
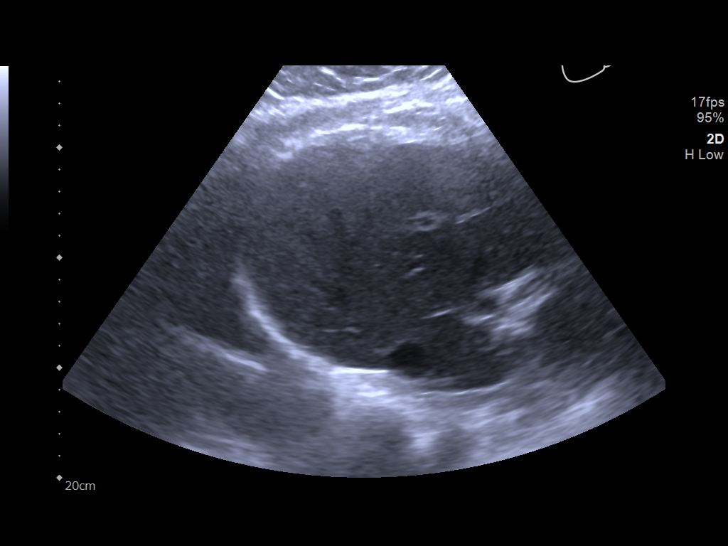
[im 17/27]
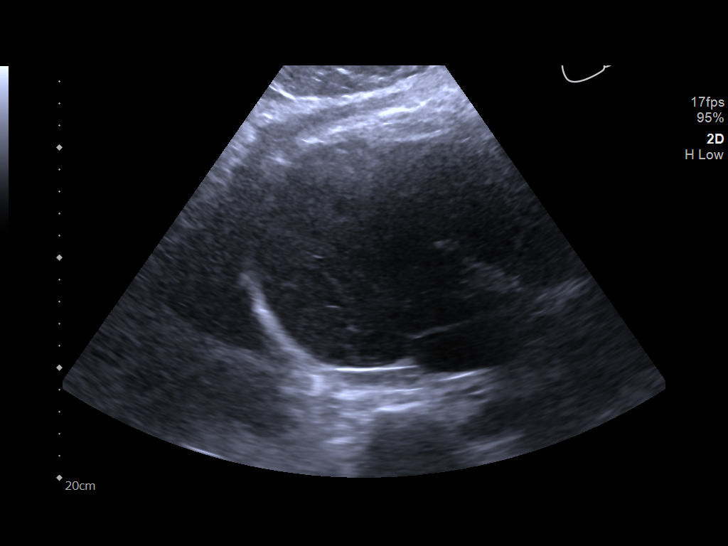
[im 18/27]
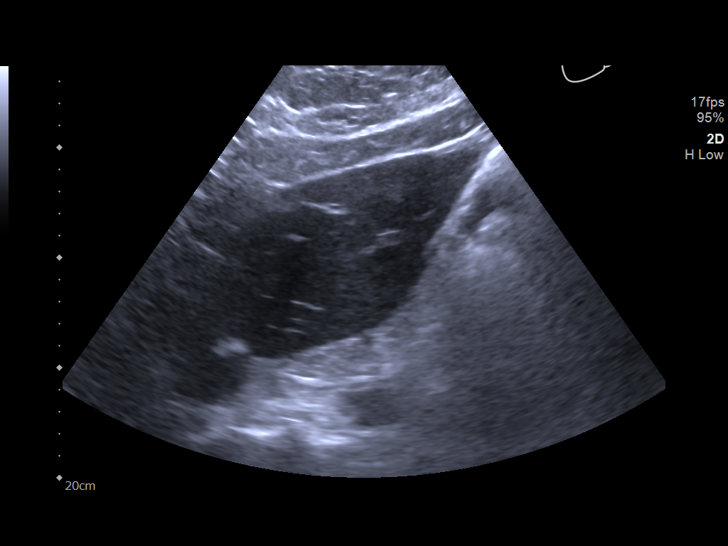
[im 20/27]
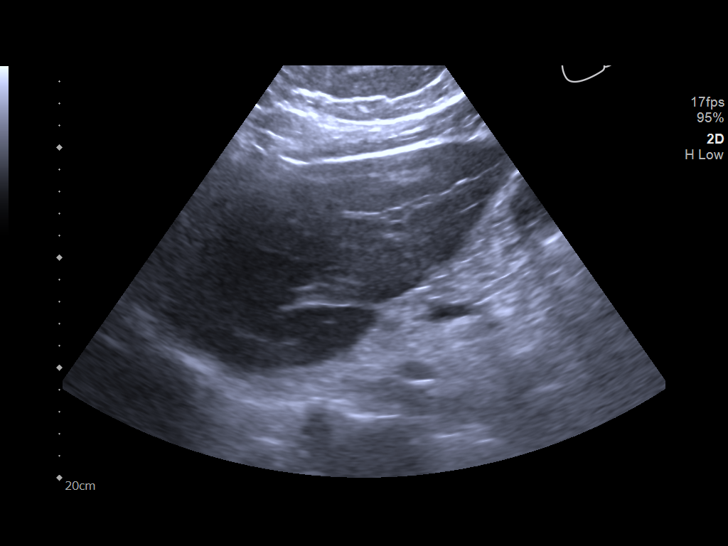
[im 22/27]
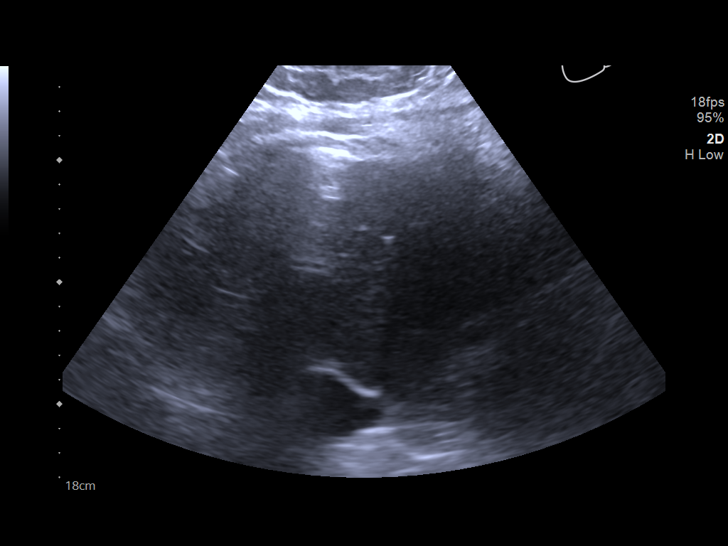
[im 24/27]
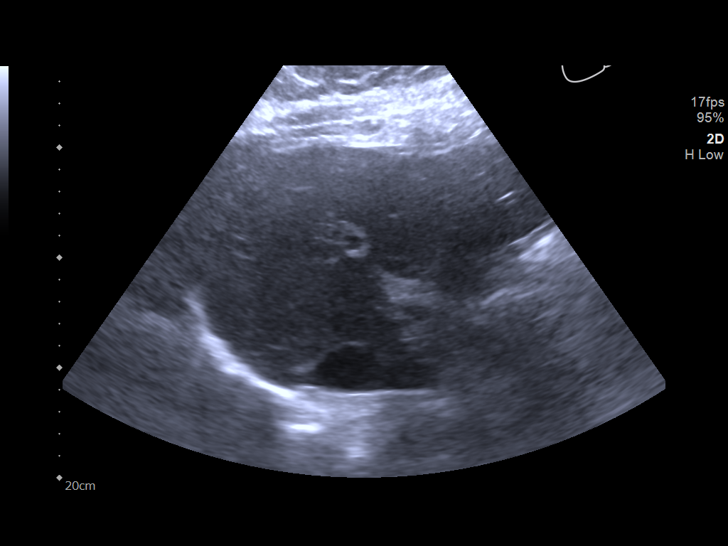
[im 27/27]
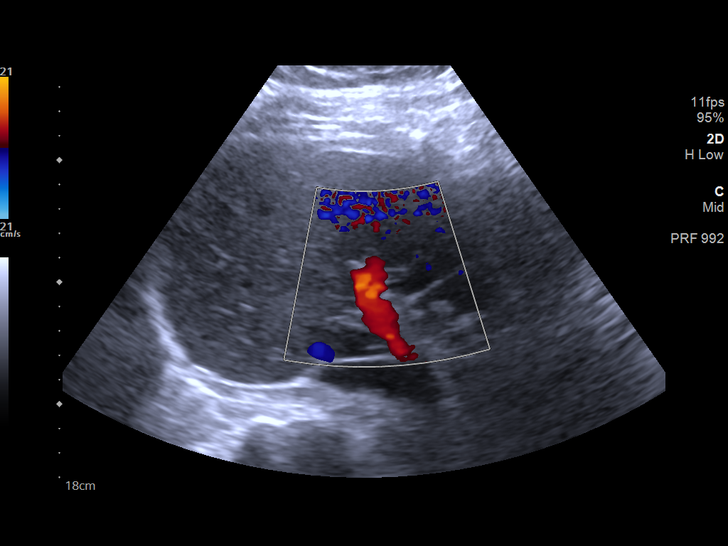

[14 of 25 positions shown; findings below may reference images not displayed]

FINDINGS: Gallbladder:

Multiple layering shadowing gallstones within the nondistended
gallbladder. No definite gallbladder wall thickening. No
pericholecystic fluid. No sonographic Murphy sign.

Common bile duct:

Diameter: 5 mm

Liver:

No focal lesion identified. Within normal limits in parenchymal
echogenicity. Portal vein is patent on color Doppler imaging with
normal direction of blood flow towards the liver. Small right
pleural effusion noted.
IMPRESSION: 1. Cholelithiasis.  No evidence of acute cholecystitis.
2. No biliary ductal dilatation.
3. Normal liver.
4. Small right pleural effusion incidentally noted.

## 2020-10-07 IMAGING — CT CT ANGIO CHEST
2 of 8 series · 18 of 46 positions shown · IV contrast (iopamidol)
Comparison: 03/18/2016 CT chest.

CLINICAL DATA: 32 y/o M; shortness of breath, cough, tachycardia,
chest pain.

EXAM:
CT ANGIOGRAPHY CHEST WITH CONTRAST
TECHNIQUE: Multidetector CT imaging of the chest was performed using the
standard protocol during bolus administration of intravenous
contrast. Multiplanar CT image reconstructions and MIPs were
obtained to evaluate the vascular anatomy.
CONTRAST:  100mL QSUFVN-BIJ IOPAMIDOL (QSUFVN-BIJ) INJECTION 76%

[Series 6: thins · axial · 0.81mm/px · z∈[-82,+186]mm · 15 of 296 slices shown]
[im 14/296  lung]
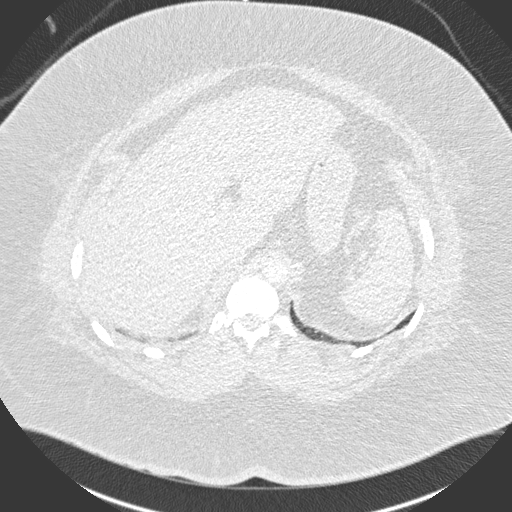
[im 41/296  soft-tissue]
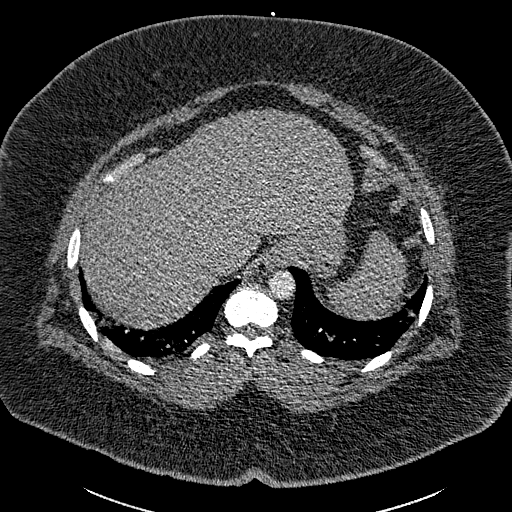
[im 54/296  lung]
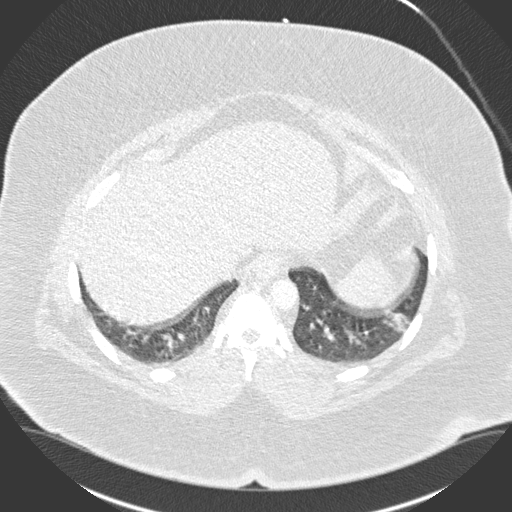
[im 68/296  soft-tissue]
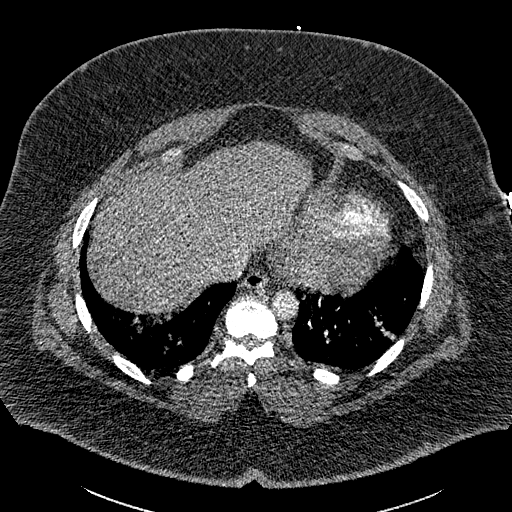
[im 94/296  lung]
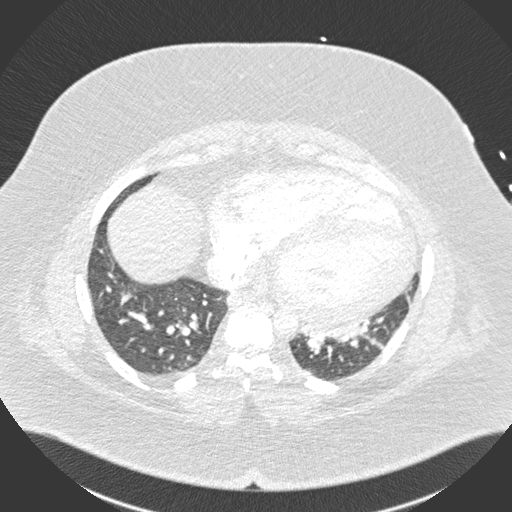
[im 108/296  soft-tissue]
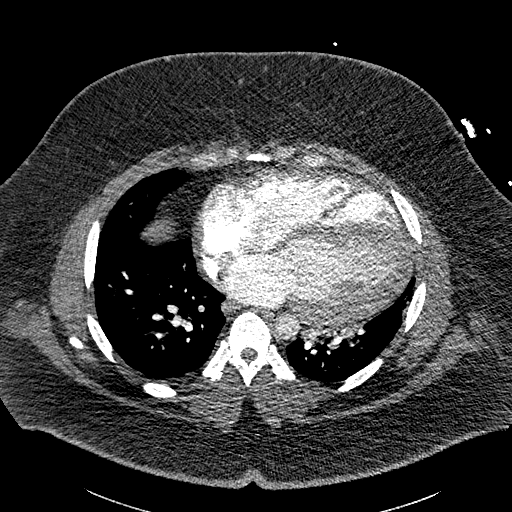
[im 135/296  lung]
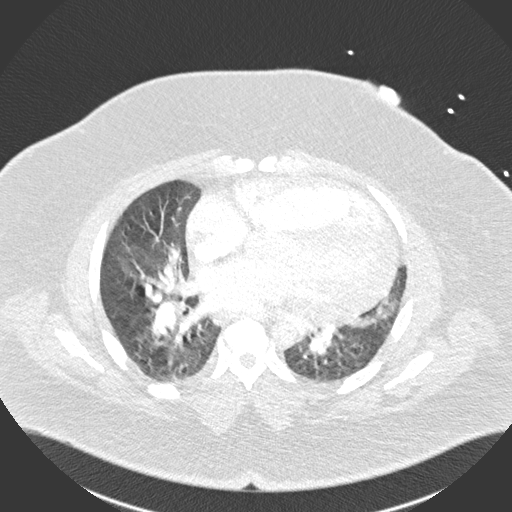
[im 148/296  soft-tissue]
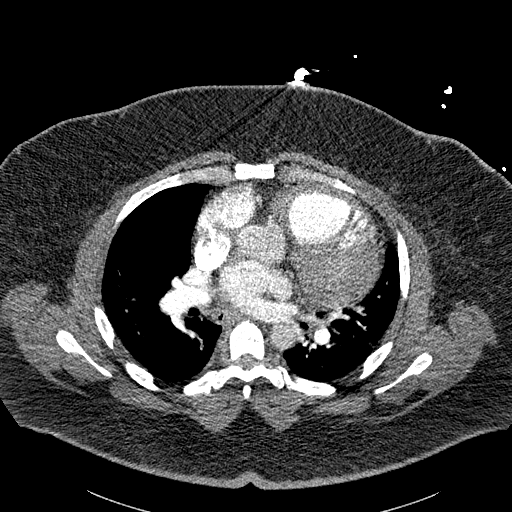
[im 161/296  lung]
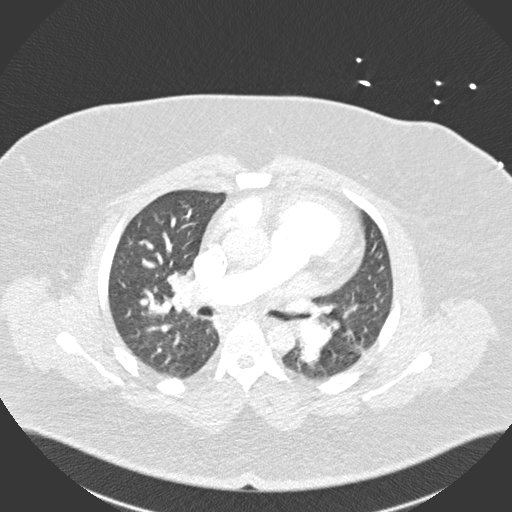
[im 188/296  soft-tissue]
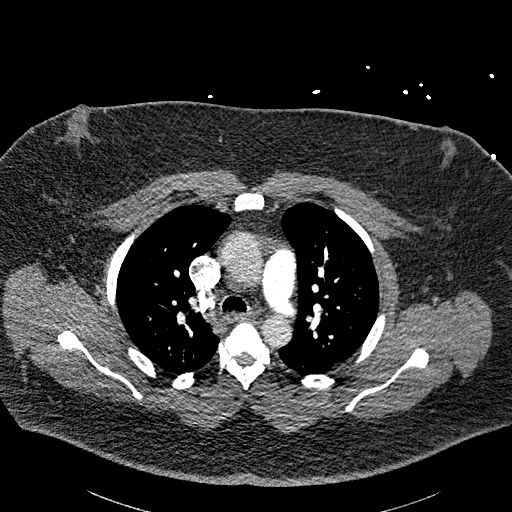
[im 202/296  lung]
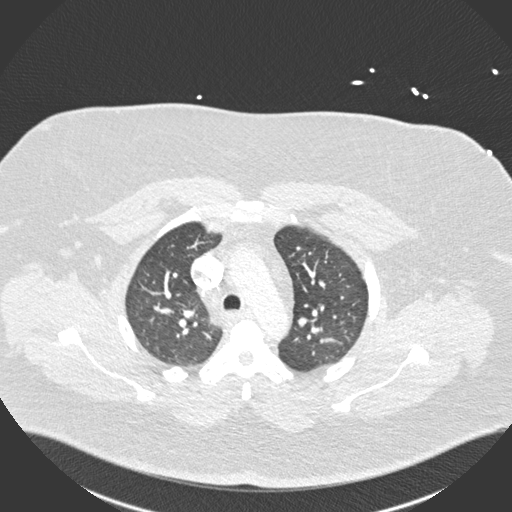
[im 228/296  soft-tissue]
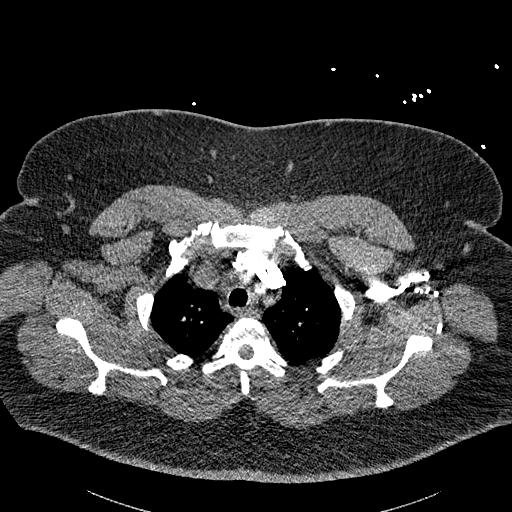
[im 242/296  lung]
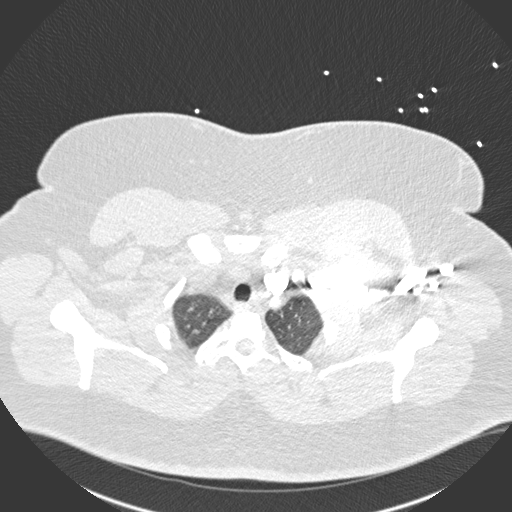
[im 255/296  soft-tissue]
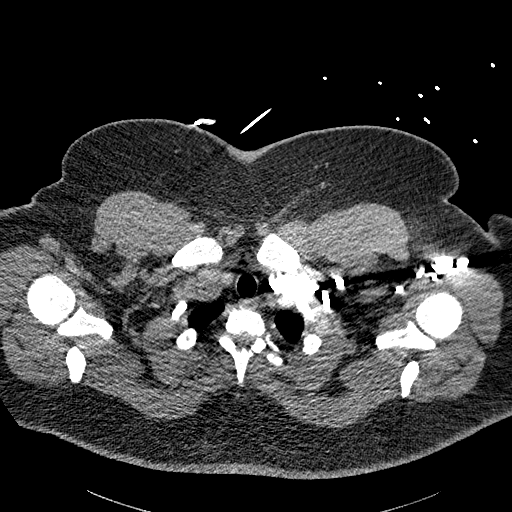
[im 282/296  lung]
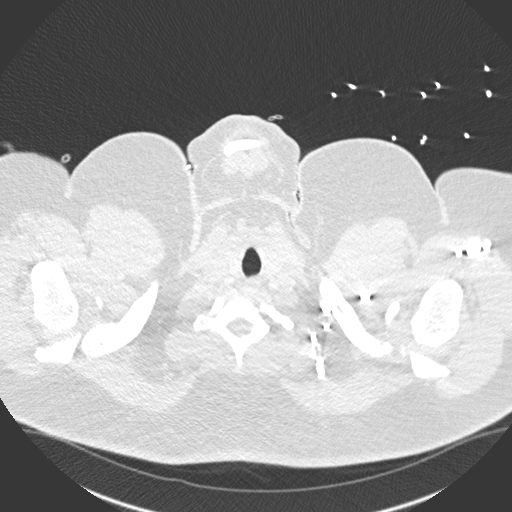

[Series 8: coronal mpr · coronal · 0.59mm/px · 3 of 175 slices shown]
[im 44/175  soft-tissue]
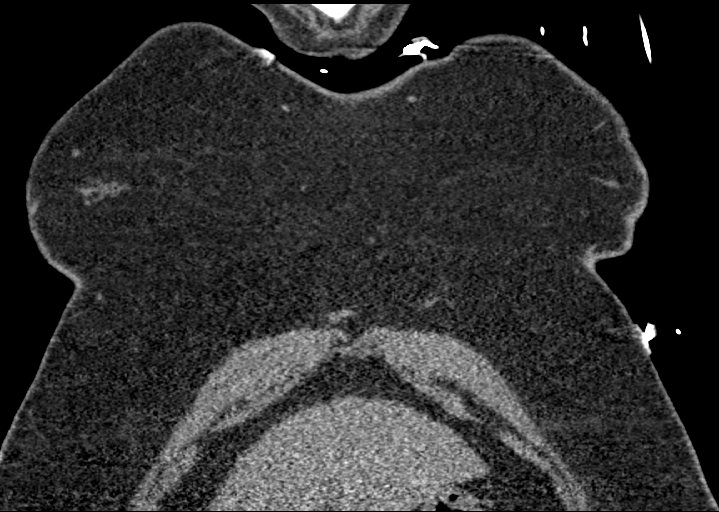
[im 88/175  soft-tissue]
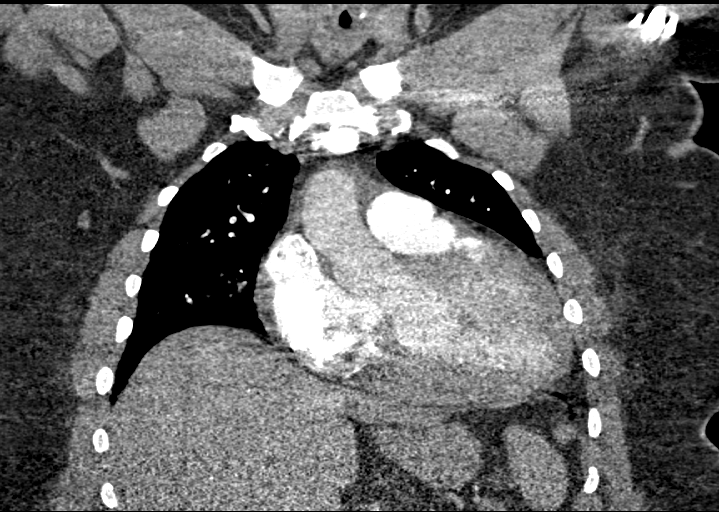
[im 131/175  soft-tissue]
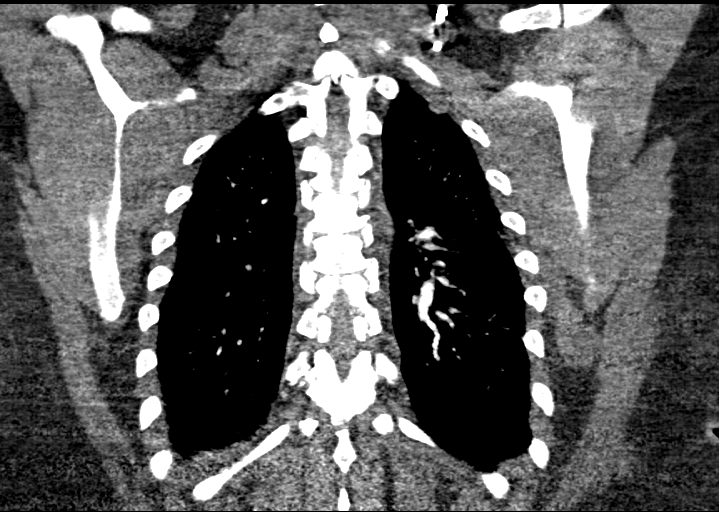

[18 of 46 positions shown; findings below may reference images not displayed]

FINDINGS: Cardiovascular: Stable cardiomegaly. No pericardial effusion. Normal
caliber thoracic aorta and main pulmonary artery. Satisfactory
opacification of the pulmonary arteries mild respiratory motion
artifact at the lung bases.

Mediastinum/Nodes: No enlarged mediastinal, hilar, or axillary lymph
nodes. Thyroid gland, trachea, and esophagus demonstrate no
significant findings.

Lungs/Pleura: 5 mm right upper lobe pulmonary nodule (series 8,
image 102). Peribronchial thickening and minor atelectasis in the
lung bases. No consolidation, effusion, or pneumothorax.

Upper Abdomen: No acute abnormality.

Musculoskeletal: No chest wall abnormality. No acute or significant
osseous findings.

Review of the MIP images confirms the above findings.
IMPRESSION: 1. Mild respiratory motion artifact at the lung bases. No pulmonary
embolus identified.
2. Peribronchial thickening in the lung bases may reflect vascular
congestion, bronchitis, or reactive airway disease. No
consolidation.
3. 5 mm right upper lobe pulmonary nodule. No follow-up needed if
patient is low-risk. Non-contrast chest CT can be considered in 12
months if patient is high-risk. This recommendation follows the
consensus statement: Guidelines for Management of Incidental
Pulmonary Nodules Detected on CT Images: From the [HOSPITAL]
4. Stable cardiomegaly.

## 2022-11-15 ENCOUNTER — Telehealth: Payer: Federal, State, Local not specified - PPO | Admitting: Physician Assistant

## 2022-11-15 DIAGNOSIS — L03116 Cellulitis of left lower limb: Secondary | ICD-10-CM

## 2022-11-15 DIAGNOSIS — L03115 Cellulitis of right lower limb: Secondary | ICD-10-CM

## 2022-11-15 DIAGNOSIS — I872 Venous insufficiency (chronic) (peripheral): Secondary | ICD-10-CM

## 2022-11-15 MED ORDER — DOXYCYCLINE HYCLATE 100 MG PO CAPS: 100.0000 mg | ORAL_CAPSULE | Freq: Two times a day (BID) | ORAL | 0 refills | Status: AC

## 2022-11-15 NOTE — Progress Notes (Signed)
Virtual Visit Consent   Edward Castro, you are scheduled for a virtual visit with a [facility] provider today. Just as with appointments in the office, your consent must be obtained to participate. Your consent will be active for this visit and any virtual visit you may have with one of our providers in the next 365 days. If you have a MyChart account, a copy of this consent can be sent to you electronically.  As this is a virtual visit, video technology does not allow for your provider to perform a traditional examination. This may limit your provider's ability to fully assess your condition. If your provider identifies any concerns that need to be evaluated in person or the need to arrange testing (such as labs, EKG, etc.), we will make arrangements to do so. Although advances in technology are sophisticated, we cannot ensure that it will always work on either your end or our end. If the connection with a video visit is poor, the visit may have to be switched to a telephone visit. With either a video or telephone visit, we are not always able to ensure that we have a secure connection.  By engaging in this virtual visit, you consent to the provision of healthcare and authorize for your insurance to be billed (if applicable) for the services provided during this visit. Depending on your insurance coverage, you may receive a charge related to this service.  I need to obtain your verbal consent now. Are you willing to proceed with your visit today? Edward Castro has provided verbal consent on 11/15/2022 for a virtual visit (video or telephone). Leeanne Rio, [place]  Date: 11/15/2022 7:22 PM  Virtual Visit via Video Note   I, Leeanne Rio, connected with  Edward Castro  ([id number], 1986-07-09) on 11/15/22 at  7:15 PM EST by a video-enabled telemedicine application and verified that I am speaking with the correct person using two identifiers.  Location: Patient: Virtual Visit Location Patient:  Home Provider: Virtual Visit Location Provider: Home Office   I discussed the limitations of evaluation and management by telemedicine and the availability of in person appointments. The patient expressed understanding and agreed to proceed.    History of Present Illness: Edward Castro is a 37 y.o. who identifies as a male who was assigned male at birth, and is being seen today for possible cellulitis of lower legs bilaterally, starting on L leg but now on both. Chronic swelling due to history of heart failure, is usually mostly well-controlled with his diuretics. Denies change in diet. Has not been elevating legs or wearing compression garments. Notes dog scratched his legs which started the redness and tenderness. Denies any PND or orthopnea. No change in baseline breathing.   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    Aphakia of both eyes    COPD (chronic obstructive pulmonary disease) ([place])    Suspected sleep apnea 11/16/2018   Morbid (severe) obesity due to excess calories ([place]) 11/16/2018   Edema 11/16/2018   Chronic respiratory failure with hypoxia and hypercapnia (HCC) 11/16/2018   Chronic diastolic (congestive) heart failure ([place]) 11/08/2018   Diabetes mellitus without complication ([place])    DVT (deep venous thrombosis) ([place])    Acute  respiratory failure with hypoxia ([place]) 11/07/2018   Acute bronchitis 11/07/2018   Chest pain 11/07/2018   Polycythemia 11/07/2018   Pulmonary nodule 11/07/2018   Essential hypertension 11/07/2018    Allergies:  Allergies  Allergen Reactions   Lisinopril Swelling   Medications:  Current Outpatient Medications:    atorvastatin (LIPITOR) 40 MG tablet, Take by mouth., Disp: , Rfl:    budesonide-formoterol (SYMBICORT)  160-4.5 MCG/ACT inhaler, Inhale 2 puffs into the lungs 2 (two) times daily., Disp: , Rfl:    metoprolol succinate (TOPROL-XL) 100 MG 24 hr tablet, Take by mouth., Disp: , Rfl:    spironolactone (ALDACTONE) 25 MG tablet, Take 0.5 tablets by mouth daily., Disp: , Rfl:    aspirin EC 81 MG tablet, Take 1 tablet by mouth daily., Disp: , Rfl:    furosemide (LASIX) 20 MG tablet, Take 1 tablet (20 mg total) by mouth daily for 4 days., Disp: 4 tablet, Rfl: 0   furosemide (LASIX) 40 MG tablet, Take 40 mg by mouth 2 (two) times daily., Disp: , Rfl:    hydrALAZINE (APRESOLINE) 25 MG tablet, Take 25 mg by mouth 2 (two) times daily., Disp: , Rfl:    losartan (COZAAR) 50 MG tablet, Take 50 mg by mouth daily., Disp: , Rfl:    metFORMIN (GLUCOPHAGE) 500 MG tablet, Take 500 mg by mouth daily., Disp: , Rfl:    metoprolol tartrate (LOPRESSOR) 100 MG tablet, Take 100 mg by mouth 2 (two) times daily., Disp: , Rfl:    mupirocin cream (BACTROBAN) 2 %, Apply 1 application topically 2 (two) times daily., Disp: 15 g, Rfl: 0   pravastatin (PRAVACHOL) 20 MG tablet, Take 1 tablet by mouth daily., Disp: , Rfl:   Observations/Objective: Patient is well-developed, well-nourished in no acute distress.  Resting comfortably at home.  Head is normocephalic, atraumatic.  No labored breathing.  Speech is clear and coherent with logical content.  Patient is alert and oriented at baseline.  Bilateral lower extremities observed with evidence of some chronic venous stasis dermatitis. There is a mild ulceration of RLE and some blistering noted of anterior LLE with surrounding erythema.   Assessment and Plan: 1. Chronic venous stasis dermatitis of both lower extremities  2. Cellulitis of both lower extremities  Continue diuretics. Minimize salt intake. Is not elevating legs so is to start this. Has compression stocking that he needs to start wearing. No alarm signs or symptoms. Wound care discussed. Will start course of Doxycycline.  Want him to follow-up with regular providers within a week, sooner if any new or worsening symptoms despite treatment.   Follow Up Instructions: I discussed the assessment and treatment plan with the patient. The patient was provided an opportunity to ask questions and all were answered. The patient agreed with the plan and demonstrated an understanding of the instructions.  A copy of instructions were sent to the patient via MyChart unless otherwise noted below.   The patient was advised to call back or seek an in-person evaluation if the symptoms worsen or if the condition fails to improve as anticipated.  Time:  I spent 15 minutes with the patient via telehealth technology discussing the above problems/concerns.    Leeanne Rio, PA-C

## 2022-11-15 NOTE — Patient Instructions (Signed)
  Laurey Morale, thank you for joining Leeanne Rio, PA-C for today's virtual visit.  While this provider is not your primary care provider (PCP), if your PCP is located in our provider database this encounter information will be shared with them immediately following your visit.   [facility] account gives you access to today's visit and all your visits, tests, and labs performed at [facility] " click here if you don't have a [facility] account or go to [email or web address]  Consent: (Patient) Edward Castro provided verbal consent for this virtual visit at the beginning of the encounter.  Current Medications:  Current Outpatient Medications:    dextromethorphan-guaiFENesin (MUCINEX DM) 30-600 MG 12hr tablet, Take 2 tablets by mouth 2 (two) times daily as needed for cough (and mucous)., Disp: 30 tablet, Rfl: 0   furosemide (LASIX) 20 MG tablet, Take 1 tablet (20 mg total) by mouth daily for 4 days., Disp: 4 tablet, Rfl: 0   furosemide (LASIX) 40 MG tablet, Take 40 mg by mouth 2 (two) times daily., Disp: , Rfl:    hydrALAZINE (APRESOLINE) 25 MG tablet, Take 25 mg by mouth 2 (two) times daily., Disp: , Rfl:    metFORMIN (GLUCOPHAGE) 500 MG tablet, Take 500 mg by mouth daily., Disp: , Rfl:    metoprolol tartrate (LOPRESSOR) 100 MG tablet, Take 100 mg by mouth 2 (two) times daily., Disp: , Rfl:    mupirocin cream (BACTROBAN) 2 %, Apply 1 application topically 2 (two) times daily., Disp: 15 g, Rfl: 0   pravastatin (PRAVACHOL) 20 MG tablet, Take 1 tablet by mouth daily., Disp: , Rfl:    Medications ordered in this encounter:  No orders of the defined types were placed in this encounter.    *If you need refills on other medications prior to your next appointment, please contact your pharmacy*  Follow-Up: Call back or seek an in-person evaluation if the symptoms worsen or if the condition fails to improve as anticipated.  [place] [phone]  Other Instructions Elevate legs while resting Keep skin clean and dry.  You can apply thin layer of vaseline over the blister that ruptured. Take the antibiotic as directed. Start use of your compression stockings and continue your fluid pills as directed by your regular providers. You need to schedule an in-person follow-up with them within 1 week. Sooner if anything new or worsening.    If you have been instructed to have an in-person evaluation today at a local Urgent Care facility, please use the link below. It will take you to a list of all of our available [facility], including address, phone number and hours of operation. Please do not delay care.  [facility]  If you or a family member do not have a primary care provider, use the link below to schedule a visit and establish care. When you choose a [facility] primary care physician or advanced practice provider, you gain a long-term partner in health. Find a Primary Care Provider  Learn more about [facility]'s in-office and virtual care options: [facility]
